# Patient Record
Sex: Female | Born: 2000 | Race: Black or African American | Hispanic: No | Marital: Single | Smoking: Never smoker
Health system: Southern US, Community
[De-identification: ages and names within clinical notes are randomized; demographics above are authoritative.]

## PROBLEM LIST (undated history)

## (undated) DIAGNOSIS — R519 Headache, unspecified: Secondary | ICD-10-CM

## (undated) DIAGNOSIS — F4310 Post-traumatic stress disorder, unspecified: Secondary | ICD-10-CM

## (undated) DIAGNOSIS — R51 Headache: Secondary | ICD-10-CM

## (undated) DIAGNOSIS — I10 Essential (primary) hypertension: Secondary | ICD-10-CM

## (undated) HISTORY — PX: TONSILECTOMY, ADENOIDECTOMY, BILATERAL MYRINGOTOMY AND TUBES: SHX2538

## (undated) HISTORY — PX: TONSILLECTOMY: SUR1361

## (undated) HISTORY — PX: WISDOM TOOTH EXTRACTION: SHX21

---
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
## 2018-01-10 ENCOUNTER — Other Ambulatory Visit: Payer: Self-pay

## 2018-01-10 ENCOUNTER — Encounter (HOSPITAL_COMMUNITY): Payer: Self-pay | Admitting: Emergency Medicine

## 2018-01-10 DIAGNOSIS — Z79899 Other long term (current) drug therapy: Secondary | ICD-10-CM | POA: Diagnosis not present

## 2018-01-10 DIAGNOSIS — Z7722 Contact with and (suspected) exposure to environmental tobacco smoke (acute) (chronic): Secondary | ICD-10-CM | POA: Insufficient documentation

## 2018-01-10 DIAGNOSIS — K8020 Calculus of gallbladder without cholecystitis without obstruction: Secondary | ICD-10-CM | POA: Diagnosis not present

## 2018-01-10 DIAGNOSIS — I10 Essential (primary) hypertension: Secondary | ICD-10-CM | POA: Diagnosis not present

## 2018-01-10 DIAGNOSIS — R1013 Epigastric pain: Secondary | ICD-10-CM | POA: Diagnosis present

## 2018-01-10 LAB — COMPREHENSIVE METABOLIC PANEL
ALBUMIN: 3.8 g/dL (ref 3.5–5.0)
ALK PHOS: 102 U/L (ref 38–126)
ALT: 13 U/L — ABNORMAL LOW (ref 14–54)
ANION GAP: 11 (ref 5–15)
AST: 19 U/L (ref 15–41)
BUN: 11 mg/dL (ref 6–20)
CALCIUM: 9.8 mg/dL (ref 8.9–10.3)
CO2: 24 mmol/L (ref 22–32)
Chloride: 104 mmol/L (ref 101–111)
Creatinine, Ser: 0.65 mg/dL (ref 0.44–1.00)
GFR calc non Af Amer: >60 mL/min (ref >60)
Glucose, Bld: 92 mg/dL (ref 65–99)
POTASSIUM: 3.9 mmol/L (ref 3.5–5.1)
SODIUM: 139 mmol/L (ref 135–145)
TOTAL PROTEIN: 7.5 g/dL (ref 6.5–8.1)
Total Bilirubin: 0.3 mg/dL (ref 0.3–1.2)

## 2018-01-10 LAB — CBC
HEMATOCRIT: 39.6 % (ref 36.0–46.0)
HEMOGLOBIN: 12.9 g/dL (ref 12.0–15.0)
MCH: 25.6 pg — AB (ref 26.0–34.0)
MCHC: 32.6 g/dL (ref 30.0–36.0)
MCV: 78.6 fL (ref 78.0–100.0)
Platelets: 312 10*3/uL (ref 150–400)
RBC: 5.04 MIL/uL (ref 3.87–5.11)
RDW: 14.5 % (ref 11.5–15.5)
WBC: 13.6 10*3/uL — ABNORMAL HIGH (ref 4.0–10.5)

## 2018-01-10 LAB — LIPASE, BLOOD: Lipase: 34 U/L (ref 11–51)

## 2018-01-10 NOTE — ED Triage Notes (Signed)
Pt c/o 10/10 intermittent abd pain for the past 2 days with nausea no vomiting, fever or chills.

## 2018-01-11 ENCOUNTER — Emergency Department (HOSPITAL_COMMUNITY)
Admission: EM | Admit: 2018-01-11 | Discharge: 2018-01-11 | Disposition: A | Discharge status: Home / Self Care | Payer: Medicaid Other | Attending: Emergency Medicine | Admitting: Emergency Medicine

## 2018-01-11 ENCOUNTER — Emergency Department (HOSPITAL_COMMUNITY): Payer: Medicaid Other

## 2018-01-11 DIAGNOSIS — R1013 Epigastric pain: Secondary | ICD-10-CM

## 2018-01-11 DIAGNOSIS — K8020 Calculus of gallbladder without cholecystitis without obstruction: Secondary | ICD-10-CM

## 2018-01-11 LAB — URINALYSIS, ROUTINE W REFLEX MICROSCOPIC
BILIRUBIN URINE: NEGATIVE
Glucose, UA: NEGATIVE mg/dL
Ketones, ur: NEGATIVE mg/dL
Leukocytes, UA: NEGATIVE
NITRITE: NEGATIVE
PROTEIN: NEGATIVE mg/dL
Specific Gravity, Urine: 1.021 (ref 1.005–1.030)
pH: 5 (ref 5.0–8.0)

## 2018-01-11 LAB — PREGNANCY, URINE: PREG TEST UR: NEGATIVE

## 2018-01-11 MED ORDER — GI COCKTAIL ~~LOC~~
30.0000 mL | Freq: Once | ORAL | Status: AC
  Administered 2018-01-11: 30 mL via ORAL
  Filled 2018-01-11: qty 30

## 2018-01-11 MED ORDER — ONDANSETRON HCL 4 MG PO TABS: 4.0000 mg | ORAL_TABLET | Freq: Four times a day (QID) | ORAL | 0 refills | Status: DC

## 2018-01-11 MED ORDER — PANTOPRAZOLE SODIUM 40 MG PO TBEC
40.0000 mg | DELAYED_RELEASE_TABLET | Freq: Once | ORAL | Status: AC
  Administered 2018-01-11: 40 mg via ORAL
  Filled 2018-01-11: qty 1

## 2018-01-11 MED ORDER — PANTOPRAZOLE SODIUM 20 MG PO TBEC: 20.0000 mg | DELAYED_RELEASE_TABLET | Freq: Every day | ORAL | 0 refills | Status: DC

## 2018-01-11 MED ORDER — ACETAMINOPHEN 325 MG PO TABS
650.0000 mg | ORAL_TABLET | Freq: Once | ORAL | Status: AC
  Administered 2018-01-11: 650 mg via ORAL
  Filled 2018-01-11: qty 2

## 2018-01-11 NOTE — ED Notes (Signed)
Patient transported to Ultrasound 

## 2018-01-11 NOTE — ED Provider Notes (Signed)
Signout from TRW AutomotiveKelly Humes, PA-C at shift change See previous provider note for full H&P  Briefly, patient with epigastric pain, intermittent; no vomiting.  Possible GERD, but biliary colic possibility. RUQ US and UA pending. If negative, plan to discharge home with PPI.  RUQ US shows:  [Examination limited by habitus and bowel gas.  Gallstones measuring up to 2.5 cm. Portion of gallbladder wall appears slightly thickened measuring up to 4.4 mm. No obvious pericholecystic fluid. Per ultrasound technologist, patient was not tender over this region during scanning.  Slightly prominent proximal common bile duct measuring 5.6 mm.  Remainder of exam unremarkable taking into account limitation by habitus and bowel gas.]  UA shows large hematuria, too numerous to count RBCs, however patient is on her menstrual cycle.  No signs of infection.  Will have patient follow-up with general surgery.  Will discharge home with PPI and Zofran, as needed.  Return precautions discussed.  Patient understands and agrees with plan.  Patient vitals stable throughout ED course and discharged in satisfactory condition.   Ebony Wilson, Ebony Bromwell M, PA-C 01/11/18 1010    Ebony Wilson, David, MD 01/17/18 2250

## 2018-01-11 NOTE — ED Provider Notes (Signed)
[facility] EMERGENCY DEPARTMENT Provider Note   CSN: [id number] Arrival date & time: 01/10/18  2028     History   Chief Complaint Chief Complaint  Patient presents with  . Abdominal Pain    HPI Ebony Wilson is a 17 y.o. female.   17 year old female with no significant past medical history presents to the emergency department for evaluation of epigastric abdominal pain.  Symptoms have been present over the past 3 days.  Pain is intermittent, slightly worse with eating.  It is associated with nausea.  No medications taken prior to arrival for symptoms.  Patient denies history of abdominal surgeries.  She is unsure of the date of her last bowel movement.  She is currently on her menstrual cycle and reports normal, regular menses.  She has had no fevers, vomiting, dysuria, hematuria, vaginal complaints.  No reported sick contacts.  She does endorse family history of acute cholecystitis.      Past Medical History:  Diagnosis Date  . Hypertension   . PTSD (post-traumatic stress disorder) 07/18/2016    Patient Active Problem List   Diagnosis Date Noted  . PTSD (post-traumatic stress disorder) 07/18/2016  . MDD (major depressive disorder), recurrent episode, severe (HCC) 07/17/2016    Past Surgical History:  Procedure Laterality Date  . TONSILECTOMY, ADENOIDECTOMY, BILATERAL MYRINGOTOMY AND TUBES    . TONSILLECTOMY     age 66    OB History    No data available       Home Medications    Prior to Admission medications   Medication Sig Start Date End Date Taking? Authorizing Provider  acetaminophen (TYLENOL) 325 MG tablet Take 650 mg by mouth every 6 (six) hours as needed.    [provider]  fluticasone (FLONASE) 50 MCG/ACT nasal spray Place 1 spray into both nostrils 2 (two) times daily. 11/02/17   Elvina Sidle, MD  ibuprofen (ADVIL,MOTRIN) 600 MG tablet Take 1 tablet (600 mg total) by mouth every 6 (six) hours as needed. 10/29/16    Earley Favor, NP  sertraline (ZOLOFT) 25 MG tablet Take 1 tablet (25 mg total) by mouth daily. 07/22/16   Denzil Magnuson, NP    Family History No family history on file.  Social History Social History   Tobacco Use  . Smoking status: Passive Smoke Exposure - Never Smoker  . Smokeless tobacco: Never Used  Substance Use Topics  . Alcohol use: No  . Drug use: No     Allergies   Watermelon [citrullus vulgaris]   Review of Systems Review of Systems Ten systems reviewed and are negative for acute change, except as noted in the HPI.    Physical Exam Updated Vital Signs BP 124/66 (BP Location: Right Arm)   Pulse 75   Temp 97.9 F (36.6 C) (Oral)   Resp 18   Ht 5\' 5"  (1.651 m)   Wt 136.1 kg (300 lb)   LMP 01/09/2018   SpO2 100%   BMI 49.92 kg/m   Physical Exam  Constitutional: She is oriented to person, place, and time. She appears well-developed and well-nourished. No distress.  Nontoxic appearing and in NAD  HENT:  Head: Normocephalic and atraumatic.  Eyes: Conjunctivae and EOM are normal. No scleral icterus.  Neck: Normal range of motion.  Cardiovascular: Normal rate, regular rhythm and intact distal pulses.  Pulmonary/Chest: Effort normal. No stridor. No respiratory distress. She has no wheezes.  Respirations even and unlabored  Abdominal: Soft. Normal appearance. She exhibits no distension. There is  tenderness. There is no guarding.  Soft, obese abdomen.  Mild tenderness in the epigastrium as well as the right upper quadrant.  Negative Murphy sign.  No palpable masses or peritoneal signs.  Musculoskeletal: Normal range of motion.  Neurological: She is alert and oriented to person, place, and time. She exhibits normal muscle tone. Coordination normal.  Skin: Skin is warm and dry. No rash noted. She is not diaphoretic. No erythema. No pallor.  Psychiatric: She has a normal mood and affect. Her behavior is normal.  Nursing note and vitals reviewed.    ED  Treatments / Results  Labs (all labs ordered are listed, but only abnormal results are displayed) Labs Reviewed  COMPREHENSIVE METABOLIC PANEL - Abnormal; Notable for the following components:      Result Value   ALT 13 (*)    All other components within normal limits  CBC - Abnormal; Notable for the following components:   WBC 13.6 (*)    MCH 25.6 (*)    All other components within normal limits  LIPASE, BLOOD  URINALYSIS, ROUTINE W REFLEX MICROSCOPIC  PREGNANCY, URINE    EKG  EKG Interpretation None       Radiology No results found.  Procedures Procedures (including critical care time)  Medications Ordered in ED Medications  gi cocktail (Maalox,Lidocaine,Donnatal) (30 mLs Oral Given 01/11/18 0541)     Initial Impression / Assessment and Plan / ED Course  I have reviewed the triage vital signs and the nursing notes.  Pertinent labs & imaging results that were available during my care of the patient were reviewed by me and considered in my medical decision making (see chart for details).     17 year old female presents to the emergency department for intermittent, waxing and waning epigastric pain.  She has mild reproducible tenderness on exam.  Abdomen soft, obese.  No peritoneal signs.  LFTs are reassuring, though unable to rule out biliary colic.  Ultrasound ordered which is in process.  Unclear whether leukocytosis may be the patient's baseline; no old for comparison.  Vitals do not currently suggest infectious etiology.  The patient has no history of fever.  No tachycardia.  Urinalysis is also pending to evaluate for UTI and potential pregnancy.  Patient signed out to Glenford BayleyAlex Law, PA-C at change of shift who will follow-up on pending imaging and labs and disposition appropriately.  If imaging and urinalysis are negative, anticipate discharge with PPI for management of GERD.   Final Clinical Impressions(s) / ED Diagnoses   Final diagnoses:  Epigastric pain    ED  Discharge Orders    None       Antony MaduraHumes, Sheliah Fiorillo, PA-C 01/11/18 [id number]    Ward, Layla MawKristen N, DO 01/11/18 [phone]

## 2018-01-11 NOTE — Discharge Instructions (Signed)
Take Protonix daily for acid reflux symptoms.  Take Zofran every 6 hours as needed for nausea or vomiting.  Try to avoid fatty, greasy foods as well as refined carbohydrates like bread, pasta, and white rice.  Please read the instructions on cholelithiasis attached your discharge paperwork.  Please call for an appointment [facility] for further management of your gallstones.  Please return to the emergency department if you develop any new or worsening symptoms including persistent fever over 100.4, intractable vomiting, significant worsening of your pain that is not going away after 6-8 hours, or any other new or concerning symptoms.

## 2018-01-11 NOTE — ED Triage Notes (Signed)
Pt to NF requesting update, apologized for delay 

## 2018-01-12 ENCOUNTER — Encounter (HOSPITAL_COMMUNITY): Payer: Self-pay | Admitting: Emergency Medicine

## 2018-01-12 ENCOUNTER — Emergency Department (HOSPITAL_COMMUNITY)
Admission: EM | Admit: 2018-01-12 | Discharge: 2018-01-12 | Disposition: A | Discharge status: Home / Self Care | Payer: Medicaid Other | Attending: Emergency Medicine | Admitting: Emergency Medicine

## 2018-01-12 DIAGNOSIS — Z5321 Procedure and treatment not carried out due to patient leaving prior to being seen by health care provider: Secondary | ICD-10-CM | POA: Diagnosis not present

## 2018-01-12 DIAGNOSIS — R109 Unspecified abdominal pain: Secondary | ICD-10-CM | POA: Diagnosis present

## 2018-01-12 LAB — COMPREHENSIVE METABOLIC PANEL
ALT: 13 U/L — AB (ref 14–54)
ANION GAP: 10 (ref 5–15)
AST: 17 U/L (ref 15–41)
Albumin: 3.5 g/dL (ref 3.5–5.0)
Alkaline Phosphatase: 98 U/L (ref 38–126)
BUN: 11 mg/dL (ref 6–20)
CALCIUM: 9.3 mg/dL (ref 8.9–10.3)
CHLORIDE: 105 mmol/L (ref 101–111)
CO2: 23 mmol/L (ref 22–32)
CREATININE: 0.74 mg/dL (ref 0.44–1.00)
GFR calc Af Amer: >60 mL/min (ref >60)
Glucose, Bld: 110 mg/dL — ABNORMAL HIGH (ref 65–99)
Potassium: 4 mmol/L (ref 3.5–5.1)
SODIUM: 138 mmol/L (ref 135–145)
Total Bilirubin: 0.3 mg/dL (ref 0.3–1.2)
Total Protein: 7 g/dL (ref 6.5–8.1)

## 2018-01-12 LAB — CBC
HCT: 37.3 % (ref 36.0–46.0)
HEMOGLOBIN: 12.2 g/dL (ref 12.0–15.0)
MCH: 25.5 pg — ABNORMAL LOW (ref 26.0–34.0)
MCHC: 32.7 g/dL (ref 30.0–36.0)
MCV: 78 fL (ref 78.0–100.0)
PLATELETS: 341 10*3/uL (ref 150–400)
RBC: 4.78 MIL/uL (ref 3.87–5.11)
RDW: 14.4 % (ref 11.5–15.5)
WBC: 10.3 10*3/uL (ref 4.0–10.5)

## 2018-01-12 LAB — LIPASE, BLOOD: LIPASE: 38 U/L (ref 11–51)

## 2018-01-12 MED ORDER — OXYCODONE-ACETAMINOPHEN 5-325 MG PO TABS
1.0000 | ORAL_TABLET | Freq: Once | ORAL | Status: AC
  Administered 2018-01-12: 1 via ORAL
  Filled 2018-01-12: qty 1

## 2018-01-12 NOTE — ED Notes (Signed)
No answer from pt in waiting room 

## 2018-01-12 NOTE — ED Triage Notes (Signed)
Pt reports increased abdominal pain since 2330 01/11/18 that she attributes to gallstones. Seen already for same. Pain meds not working. Nausea, took zofran PTA. Ibuprofen not effective.

## 2018-01-12 NOTE — ED Notes (Signed)
Pt given narcotic pain medicine in triage. Advised of side effects and instructed to avoid driving for a minimum of four hours.  

## 2018-01-12 NOTE — ED Notes (Signed)
Called in lobby NO answer

## 2018-01-12 NOTE — ED Notes (Signed)
Called in lobby x 2 NO answer

## 2018-01-26 ENCOUNTER — Ambulatory Visit: Payer: Self-pay | Admitting: General Surgery

## 2018-01-26 NOTE — H&P (View-Only) (Signed)
History of Present Illness Ebony Wilson(Rutledge Selsor MD; 01/26/2018 3:05 PM) The patient is a 17 year old female who presents for evaluation of gall stones. Referred by: Dr. Elesa MassedWard Chief Complaint: Epigastric abdominal pain  Patient is a 17 year old female who comes in secondary to epigastric abdominal pain. She states that the pain radiates her left quadrant. She states that the pain usually begins after eating. States that she is awoken at night approximately 3:00 in the morning with abdominal pain, nausea hypomelanosis. Patient states been going on for several months. He states become more frequent. She states that she tried to stay away from high-fat foods.  Patient does state that her mother did have her gallbladder removed secondary to gallstones.  Patient's previous abdominal surgeries.    Past Surgical History [name]([name], CMA; 01/26/2018 2:39 PM) Oral Surgery  Tonsillectomy   Diagnostic Studies History [name]([name], CMA; 01/26/2018 2:39 PM) Colonoscopy  never  Allergies [name]([name], CMA; 01/26/2018 2:39 PM) No Known Drug Allergies [01/26/2018]: Allergies Reconciled   Medication History [name]([name], CMA; 01/26/2018 2:39 PM) Pantoprazole Sodium (20MG  Tablet DR, Oral) Active. Ondansetron HCl (4MG  Tablet, Oral) Active. Medications Reconciled  Social History [name](Danielle [employer] fast streamerGerrigner, CMA; 01/26/2018 2:39 PM) Caffeine use  Tea. No alcohol use  No drug use  Tobacco use  Never smoker.  Family History [name]([name], CMA; 01/26/2018 2:39 PM) Family history unknown  First Degree Relatives   Pregnancy / Birth History [name]([name], CMA; 01/26/2018 2:39 PM) Age at menarche  6 years. Gravida  0 Irregular periods  Para  0  Other Problems [name]([name], CMA; 01/26/2018 2:39 PM) No pertinent past medical history     Review of Systems Ebony Wilson([name]; 01/26/2018 3:03 PM) General Present- Appetite Loss and Fatigue. Not  Present- Chills, Fever, Night Sweats, Weight Gain and Weight Loss. Skin Not Present- Change in Wart/Mole, Dryness, Hives, Jaundice, New Lesions, Non-Healing Wounds, Rash and Ulcer. HEENT Not Present- Earache, Hearing Loss, Hoarseness, Nose Bleed, Oral Ulcers, Ringing in the Ears, Seasonal Allergies, Sinus Pain, Sore Throat, Visual Disturbances, Wears glasses/contact lenses and Yellow Eyes. Respiratory Not Present- Bloody sputum, Chronic Cough, Difficulty Breathing, Snoring and Wheezing. Breast Not Present- Breast Mass, Breast Pain, Nipple Discharge and Skin Changes. Cardiovascular Not Present- Chest Pain, Difficulty Breathing Lying Down, Leg Cramps, Palpitations, Rapid Heart Rate, Shortness of Breath and Swelling of Extremities. Gastrointestinal Present- Abdominal Pain and Nausea. Not Present- Bloating, Bloody Stool, Change in Bowel Habits, Chronic diarrhea, Constipation, Difficulty Swallowing, Excessive gas, Gets full quickly at meals, Hemorrhoids, Indigestion, Rectal Pain and Vomiting. Female Genitourinary Not Present- Frequency, Nocturia, Painful Urination, Pelvic Pain and Urgency. Musculoskeletal Not Present- Back Pain, Joint Pain, Joint Stiffness, Muscle Pain, Muscle Weakness and Swelling of Extremities. Neurological Not Present- Decreased Memory, Fainting, Headaches, Numbness, Seizures, Tingling, Tremor, Trouble walking and Weakness. Psychiatric Not Present- Anxiety, Bipolar, Change in Sleep Pattern, Depression, Fearful and Frequent crying. Endocrine Not Present- Cold Intolerance, Excessive Hunger, Hair Changes, Heat Intolerance, Hot flashes and New Diabetes. Hematology Not Present- Blood Thinners, Easy Bruising, Excessive bleeding, Gland problems, HIV and Persistent Infections. All other systems negative  Vitals ([name] CMA; 01/26/2018 2:40 PM) 01/26/2018 2:39 PM Weight: 304.13 lb (Above 99th percentile) Height: 65.5in (69th percentile) Body Surface Area: 2.38 m Body  Mass Index: 49.84 kg/m  (99th percentile)  Temp.: 97.68F(Oral)  Pulse: 97 (Regular)  BP: 132/90 (Sitting, Right Arm, Large)  Percentiles calculated using CDC data for children 2-20 years.     Physical Exam Ebony Wilson([name] MD; 01/26/2018 3:05 PM) The  physical exam findings are as follows: Note:Constitutional: No acute distress, conversant, appears stated age  Eyes: Anicteric sclerae, moist conjunctiva, no lid lag  Neck: No thyromegaly, trachea midline, no cervical lymphadenopathy  Lungs: Clear to auscultation biilaterally, normal respiratory effot  Cardiovascular: regular rate & rhythm, no murmurs, no peripheal edema, pedal pulses 2+  GI: Soft, no masses or hepatosplenomegaly, tender to palpation in epigastrum  MSK: Normal gait, no clubbing cyanosis, edema  Skin: No rashes, palpation reveals normal skin turgor  Psychiatric: Appropriate judgment and insight, oriented to person, place, and time    Assessment & Plan Ebony Filler MD; 01/26/2018 3:06 PM) SYMPTOMATIC CHOLELITHIASIS (K80.20) Impression: 18 year old female symptomatically cholelithiasis  1. We will proceed to the operating room for a laparoscopic cholecystectomy  2. Risks and benefits were discussed with the patient to generally include, but not limited to: infection, bleeding, possible need for post op ERCP, damage to the bile ducts, bile leak, and possible need for further surgery. Alternatives were offered and described. All questions were answered and the patient voiced understanding of the procedure and wishes to proceed at this point with a laparoscopic cholecystectomy

## 2018-01-26 NOTE — H&P (Signed)
History of Present Illness Ebony Wilson([name] MD; 01/26/2018 3:05 PM) The patient is a 17 year old female who presents for evaluation of gall stones. Referred by: Dr. Elesa MassedWard Chief Complaint: Epigastric abdominal pain  Patient is a 17 year old female who comes in secondary to epigastric abdominal pain. She states that the pain radiates her left quadrant. She states that the pain usually begins after eating. States that she is awoken at night approximately 3:00 in the morning with abdominal pain, nausea hypomelanosis. Patient states been going on for several months. He states become more frequent. She states that she tried to stay away from high-fat foods.  Patient does state that her mother did have her gallbladder removed secondary to gallstones.  Patient's previous abdominal surgeries.    Past Surgical History [name]([name], CMA; 01/26/2018 2:39 PM) Oral Surgery  Tonsillectomy   Diagnostic Studies History [name]([name], CMA; 01/26/2018 2:39 PM) Colonoscopy  never  Allergies [name]([name], CMA; 01/26/2018 2:39 PM) No Known Drug Allergies [01/26/2018]: Allergies Reconciled   Medication History [name]([name], CMA; 01/26/2018 2:39 PM) Pantoprazole Sodium (20MG  Tablet DR, Oral) Active. Ondansetron HCl (4MG  Tablet, Oral) Active. Medications Reconciled  Social History [name](Danielle [employer] fast streamerGerrigner, CMA; 01/26/2018 2:39 PM) Caffeine use  Tea. No alcohol use  No drug use  Tobacco use  Never smoker.  Family History [name]([name], CMA; 01/26/2018 2:39 PM) Family history unknown  First Degree Relatives   Pregnancy / Birth History [name]([name], CMA; 01/26/2018 2:39 PM) Age at menarche  6 years. Gravida  0 Irregular periods  Para  0  Other Problems [name]([name], CMA; 01/26/2018 2:39 PM) No pertinent past medical history     Review of Systems Ebony Wilson(Jasie Meleski MD; 01/26/2018 3:03 PM) General Present- Appetite Loss and Fatigue. Not  Present- Chills, Fever, Night Sweats, Weight Gain and Weight Loss. Skin Not Present- Change in Wart/Mole, Dryness, Hives, Jaundice, New Lesions, Non-Healing Wounds, Rash and Ulcer. HEENT Not Present- Earache, Hearing Loss, Hoarseness, Nose Bleed, Oral Ulcers, Ringing in the Ears, Seasonal Allergies, Sinus Pain, Sore Throat, Visual Disturbances, Wears glasses/contact lenses and Yellow Eyes. Respiratory Not Present- Bloody sputum, Chronic Cough, Difficulty Breathing, Snoring and Wheezing. Breast Not Present- Breast Mass, Breast Pain, Nipple Discharge and Skin Changes. Cardiovascular Not Present- Chest Pain, Difficulty Breathing Lying Down, Leg Cramps, Palpitations, Rapid Heart Rate, Shortness of Breath and Swelling of Extremities. Gastrointestinal Present- Abdominal Pain and Nausea. Not Present- Bloating, Bloody Stool, Change in Bowel Habits, Chronic diarrhea, Constipation, Difficulty Swallowing, Excessive gas, Gets full quickly at meals, Hemorrhoids, Indigestion, Rectal Pain and Vomiting. Female Genitourinary Not Present- Frequency, Nocturia, Painful Urination, Pelvic Pain and Urgency. Musculoskeletal Not Present- Back Pain, Joint Pain, Joint Stiffness, Muscle Pain, Muscle Weakness and Swelling of Extremities. Neurological Not Present- Decreased Memory, Fainting, Headaches, Numbness, Seizures, Tingling, Tremor, Trouble walking and Weakness. Psychiatric Not Present- Anxiety, Bipolar, Change in Sleep Pattern, Depression, Fearful and Frequent crying. Endocrine Not Present- Cold Intolerance, Excessive Hunger, Hair Changes, Heat Intolerance, Hot flashes and New Diabetes. Hematology Not Present- Blood Thinners, Easy Bruising, Excessive bleeding, Gland problems, HIV and Persistent Infections. All other systems negative  Vitals ([name] CMA; 01/26/2018 2:40 PM) 01/26/2018 2:39 PM Weight: 304.13 lb (Above 99th percentile) Height: 65.5in (69th percentile) Body Surface Area: 2.38 m Body  Mass Index: 49.84 kg/m  (99th percentile)  Temp.: 97.68F(Oral)  Pulse: 97 (Regular)  BP: 132/90 (Sitting, Right Arm, Large)  Percentiles calculated using CDC data for children 2-20 years.     Physical Exam Ebony Wilson(Bridget Westbrooks MD; 01/26/2018 3:05 PM) The  physical exam findings are as follows: Note:Constitutional: No acute distress, conversant, appears stated age  Eyes: Anicteric sclerae, moist conjunctiva, no lid lag  Neck: No thyromegaly, trachea midline, no cervical lymphadenopathy  Lungs: Clear to auscultation biilaterally, normal respiratory effot  Cardiovascular: regular rate & rhythm, no murmurs, no peripheal edema, pedal pulses 2+  GI: Soft, no masses or hepatosplenomegaly, tender to palpation in epigastrum  MSK: Normal gait, no clubbing cyanosis, edema  Skin: No rashes, palpation reveals normal skin turgor  Psychiatric: Appropriate judgment and insight, oriented to person, place, and time    Assessment & Plan Ebony Filler MD; 01/26/2018 3:06 PM) SYMPTOMATIC CHOLELITHIASIS (K80.20) Impression: 18 year old female symptomatically cholelithiasis  1. We will proceed to the operating room for a laparoscopic cholecystectomy  2. Risks and benefits were discussed with the patient to generally include, but not limited to: infection, bleeding, possible need for post op ERCP, damage to the bile ducts, bile leak, and possible need for further surgery. Alternatives were offered and described. All questions were answered and the patient voiced understanding of the procedure and wishes to proceed at this point with a laparoscopic cholecystectomy

## 2018-02-04 ENCOUNTER — Encounter (HOSPITAL_COMMUNITY): Payer: Self-pay | Admitting: *Deleted

## 2018-02-07 MED ORDER — ACETAMINOPHEN 500 MG PO TABS
1000.0000 mg | ORAL_TABLET | ORAL | Status: AC
  Administered 2018-02-08: 1000 mg via ORAL
  Filled 2018-02-07: qty 2

## 2018-02-07 MED ORDER — CELECOXIB 200 MG PO CAPS
200.0000 mg | ORAL_CAPSULE | ORAL | Status: AC
  Administered 2018-02-08: 200 mg via ORAL
  Filled 2018-02-07: qty 1

## 2018-02-07 MED ORDER — DEXTROSE 5 % IV SOLN
3.0000 g | INTRAVENOUS | Status: AC
  Administered 2018-02-08: 3 g via INTRAVENOUS
  Filled 2018-02-07: qty 3

## 2018-02-07 MED ORDER — GABAPENTIN 300 MG PO CAPS
300.0000 mg | ORAL_CAPSULE | ORAL | Status: AC
  Administered 2018-02-08: 300 mg via ORAL
  Filled 2018-02-07: qty 1

## 2018-02-08 ENCOUNTER — Ambulatory Visit (HOSPITAL_COMMUNITY)
Admission: RE | Admit: 2018-02-08 | Discharge: 2018-02-08 | Disposition: A | Discharge status: Home / Self Care | Payer: Medicaid Other | Source: Ambulatory Visit | Attending: General Surgery | Admitting: General Surgery

## 2018-02-08 ENCOUNTER — Ambulatory Visit (HOSPITAL_COMMUNITY): Payer: Medicaid Other | Admitting: Certified Registered"

## 2018-02-08 ENCOUNTER — Encounter (HOSPITAL_COMMUNITY)
Admission: RE | Disposition: A | Discharge status: Home / Self Care | Payer: Self-pay | Source: Ambulatory Visit | Attending: General Surgery

## 2018-02-08 ENCOUNTER — Encounter (HOSPITAL_COMMUNITY): Payer: Self-pay

## 2018-02-08 DIAGNOSIS — I10 Essential (primary) hypertension: Secondary | ICD-10-CM | POA: Insufficient documentation

## 2018-02-08 DIAGNOSIS — R1013 Epigastric pain: Secondary | ICD-10-CM | POA: Diagnosis present

## 2018-02-08 DIAGNOSIS — Z68.41 Body mass index (BMI) pediatric, greater than or equal to 95th percentile for age: Secondary | ICD-10-CM | POA: Diagnosis not present

## 2018-02-08 DIAGNOSIS — K8010 Calculus of gallbladder with chronic cholecystitis without obstruction: Secondary | ICD-10-CM | POA: Insufficient documentation

## 2018-02-08 HISTORY — PX: CHOLECYSTECTOMY: SHX55

## 2018-02-08 HISTORY — DX: Headache: R51

## 2018-02-08 HISTORY — DX: Headache, unspecified: R51.9

## 2018-02-08 LAB — CBC
HCT: 39.9 % (ref 36.0–46.0)
HEMOGLOBIN: 12.5 g/dL (ref 12.0–15.0)
MCH: 24.7 pg — AB (ref 26.0–34.0)
MCHC: 31.3 g/dL (ref 30.0–36.0)
MCV: 78.7 fL (ref 78.0–100.0)
Platelets: 318 10*3/uL (ref 150–400)
RBC: 5.07 MIL/uL (ref 3.87–5.11)
RDW: 15.1 % (ref 11.5–15.5)
WBC: 11.5 10*3/uL — ABNORMAL HIGH (ref 4.0–10.5)

## 2018-02-08 LAB — HCG, SERUM, QUALITATIVE: Preg, Serum: NEGATIVE

## 2018-02-08 SURGERY — LAPAROSCOPIC CHOLECYSTECTOMY
Anesthesia: General | Site: Abdomen

## 2018-02-08 MED ORDER — PHENYLEPHRINE 40 MCG/ML (10ML) SYRINGE FOR IV PUSH (FOR BLOOD PRESSURE SUPPORT)
PREFILLED_SYRINGE | INTRAVENOUS | Status: AC
  Filled 2018-02-08: qty 10

## 2018-02-08 MED ORDER — LIDOCAINE HCL (CARDIAC) 20 MG/ML IV SOLN
INTRAVENOUS | Status: DC | PRN
  Administered 2018-02-08: 60 mg via INTRAVENOUS

## 2018-02-08 MED ORDER — BUPIVACAINE HCL 0.25 % IJ SOLN
INTRAMUSCULAR | Status: DC | PRN
  Administered 2018-02-08: 30 mL

## 2018-02-08 MED ORDER — BUPIVACAINE HCL (PF) 0.25 % IJ SOLN
INTRAMUSCULAR | Status: AC
  Filled 2018-02-08: qty 30

## 2018-02-08 MED ORDER — DEXAMETHASONE SODIUM PHOSPHATE 10 MG/ML IJ SOLN
INTRAMUSCULAR | Status: AC
  Filled 2018-02-08: qty 1

## 2018-02-08 MED ORDER — CHLORHEXIDINE GLUCONATE CLOTH 2 % EX PADS: 6.0000 | MEDICATED_PAD | Freq: Once | CUTANEOUS | Status: DC

## 2018-02-08 MED ORDER — OXYCODONE HCL 5 MG PO TABS
ORAL_TABLET | ORAL | Status: AC
  Filled 2018-02-08: qty 2

## 2018-02-08 MED ORDER — LACTATED RINGERS IV SOLN
INTRAVENOUS | Status: DC
Start: 2018-02-08 — End: 2018-02-08
  Administered 2018-02-08 (×2): via INTRAVENOUS

## 2018-02-08 MED ORDER — DEXAMETHASONE SODIUM PHOSPHATE 10 MG/ML IJ SOLN
INTRAMUSCULAR | Status: DC | PRN
  Administered 2018-02-08: 8 mg via INTRAVENOUS

## 2018-02-08 MED ORDER — HYDROMORPHONE HCL 1 MG/ML IJ SOLN
0.2500 mg | INTRAMUSCULAR | Status: DC | PRN
  Administered 2018-02-08: 0.5 mg via INTRAVENOUS

## 2018-02-08 MED ORDER — SODIUM CHLORIDE 0.9 % IR SOLN
Status: DC | PRN
  Administered 2018-02-08: 1000 mL

## 2018-02-08 MED ORDER — MIDAZOLAM HCL 5 MG/5ML IJ SOLN
INTRAMUSCULAR | Status: DC | PRN
  Administered 2018-02-08: 2 mg via INTRAVENOUS

## 2018-02-08 MED ORDER — KETOROLAC TROMETHAMINE 15 MG/ML IJ SOLN: 15.0000 mg | Freq: Four times a day (QID) | INTRAMUSCULAR | Status: DC

## 2018-02-08 MED ORDER — OXYCODONE HCL 5 MG PO TABS
5.0000 mg | ORAL_TABLET | ORAL | Status: DC | PRN
  Administered 2018-02-08: 10 mg via ORAL

## 2018-02-08 MED ORDER — MIDAZOLAM HCL 2 MG/2ML IJ SOLN
INTRAMUSCULAR | Status: AC
  Filled 2018-02-08: qty 2

## 2018-02-08 MED ORDER — FENTANYL CITRATE (PF) 100 MCG/2ML IJ SOLN
INTRAMUSCULAR | Status: DC | PRN
  Administered 2018-02-08 (×3): 50 ug via INTRAVENOUS

## 2018-02-08 MED ORDER — ONDANSETRON HCL 4 MG/2ML IJ SOLN
INTRAMUSCULAR | Status: AC
  Filled 2018-02-08: qty 2

## 2018-02-08 MED ORDER — 0.9 % SODIUM CHLORIDE (POUR BTL) OPTIME
TOPICAL | Status: DC | PRN
  Administered 2018-02-08: 1000 mL

## 2018-02-08 MED ORDER — GLYCOPYRROLATE 0.2 MG/ML IJ SOLN
INTRAMUSCULAR | Status: DC | PRN
  Administered 2018-02-08: .8 mg via INTRAVENOUS

## 2018-02-08 MED ORDER — KETOROLAC TROMETHAMINE 30 MG/ML IJ SOLN
INTRAMUSCULAR | Status: DC | PRN
  Administered 2018-02-08: 30 mg via INTRAVENOUS

## 2018-02-08 MED ORDER — ROCURONIUM BROMIDE 100 MG/10ML IV SOLN
INTRAVENOUS | Status: DC | PRN
  Administered 2018-02-08: 40 mg via INTRAVENOUS
  Administered 2018-02-08: 10 mg via INTRAVENOUS

## 2018-02-08 MED ORDER — FENTANYL CITRATE (PF) 250 MCG/5ML IJ SOLN
INTRAMUSCULAR | Status: AC
  Filled 2018-02-08: qty 5

## 2018-02-08 MED ORDER — PROPOFOL 10 MG/ML IV BOLUS
INTRAVENOUS | Status: AC
  Filled 2018-02-08: qty 20

## 2018-02-08 MED ORDER — ONDANSETRON HCL 4 MG/2ML IJ SOLN
INTRAMUSCULAR | Status: DC | PRN
  Administered 2018-02-08: 4 mg via INTRAVENOUS

## 2018-02-08 MED ORDER — NEOSTIGMINE METHYLSULFATE 5 MG/5ML IV SOSY
PREFILLED_SYRINGE | INTRAVENOUS | Status: AC
  Filled 2018-02-08: qty 5

## 2018-02-08 MED ORDER — NEOSTIGMINE METHYLSULFATE 10 MG/10ML IV SOLN
INTRAVENOUS | Status: DC | PRN
  Administered 2018-02-08: 5 mg via INTRAVENOUS

## 2018-02-08 MED ORDER — HYDROMORPHONE HCL 1 MG/ML IJ SOLN
INTRAMUSCULAR | Status: AC
  Filled 2018-02-08: qty 1

## 2018-02-08 MED ORDER — TRAMADOL HCL 50 MG PO TABS: 50.0000 mg | ORAL_TABLET | Freq: Four times a day (QID) | ORAL | 0 refills | Status: DC | PRN

## 2018-02-08 MED ORDER — PROPOFOL 10 MG/ML IV BOLUS
INTRAVENOUS | Status: DC | PRN
  Administered 2018-02-08: 200 mg via INTRAVENOUS

## 2018-02-08 SURGICAL SUPPLY — 40 items
CANISTER SUCT 3000ML PPV (MISCELLANEOUS) ×3 IMPLANT
CHLORAPREP W/TINT 26ML (MISCELLANEOUS) ×3 IMPLANT
CLIP VESOLOCK MED LG 6/CT (CLIP) ×6 IMPLANT
COVER SURGICAL LIGHT HANDLE (MISCELLANEOUS) ×3 IMPLANT
COVER TRANSDUCER ULTRASND (DRAPES) ×3 IMPLANT
DERMABOND ADVANCED (GAUZE/BANDAGES/DRESSINGS) ×2
DERMABOND ADVANCED .7 DNX12 (GAUZE/BANDAGES/DRESSINGS) ×1 IMPLANT
ELECT REM PT RETURN 9FT ADLT (ELECTROSURGICAL) ×3
ELECTRODE REM PT RTRN 9FT ADLT (ELECTROSURGICAL) ×1 IMPLANT
GLOVE BIO SURGEON STRL SZ7.5 (GLOVE) ×3 IMPLANT
GLOVE BIOGEL PI IND STRL 6.5 (GLOVE) ×1 IMPLANT
GLOVE BIOGEL PI IND STRL 8 (GLOVE) ×1 IMPLANT
GLOVE BIOGEL PI INDICATOR 6.5 (GLOVE) ×2
GLOVE BIOGEL PI INDICATOR 8 (GLOVE) ×2
GLOVE INDICATOR 7.5 STRL GRN (GLOVE) ×3 IMPLANT
GLOVE SURG SS PI 6.5 STRL IVOR (GLOVE) ×6 IMPLANT
GOWN STRL REUS W/ TWL LRG LVL3 (GOWN DISPOSABLE) ×3 IMPLANT
GOWN STRL REUS W/ TWL XL LVL3 (GOWN DISPOSABLE) ×1 IMPLANT
GOWN STRL REUS W/TWL LRG LVL3 (GOWN DISPOSABLE) ×6
GOWN STRL REUS W/TWL XL LVL3 (GOWN DISPOSABLE) ×2
GRASPER SUT TROCAR 14GX15 (MISCELLANEOUS) ×3 IMPLANT
KIT BASIN OR (CUSTOM PROCEDURE TRAY) ×3 IMPLANT
KIT ROOM TURNOVER OR (KITS) ×3 IMPLANT
NEEDLE INSUFFLATION 14GA 120MM (NEEDLE) ×3 IMPLANT
NS IRRIG 1000ML POUR BTL (IV SOLUTION) ×3 IMPLANT
PAD ARMBOARD 7.5X6 YLW CONV (MISCELLANEOUS) ×3 IMPLANT
POUCH RETRIEVAL ECOSAC 10 (ENDOMECHANICALS) ×1 IMPLANT
POUCH RETRIEVAL ECOSAC 10MM (ENDOMECHANICALS) ×2
SCISSORS LAP 5X35 DISP (ENDOMECHANICALS) ×3 IMPLANT
SET IRRIG TUBING LAPAROSCOPIC (IRRIGATION / IRRIGATOR) ×3 IMPLANT
SLEEVE ENDOPATH XCEL 5M (ENDOMECHANICALS) ×3 IMPLANT
SPECIMEN JAR SMALL (MISCELLANEOUS) ×3 IMPLANT
SUT MNCRL AB 4-0 PS2 18 (SUTURE) ×3 IMPLANT
TOWEL OR 17X24 6PK STRL BLUE (TOWEL DISPOSABLE) ×3 IMPLANT
TOWEL OR 17X26 10 PK STRL BLUE (TOWEL DISPOSABLE) IMPLANT
TRAY LAPAROSCOPIC MC (CUSTOM PROCEDURE TRAY) ×3 IMPLANT
TROCAR XCEL NON-BLD 11X100MML (ENDOMECHANICALS) ×3 IMPLANT
TROCAR XCEL NON-BLD 5MMX100MML (ENDOMECHANICALS) ×3 IMPLANT
TUBING INSUFFLATION (TUBING) ×3 IMPLANT
WATER STERILE IRR 1000ML POUR (IV SOLUTION) ×3 IMPLANT

## 2018-02-08 NOTE — Transfer of Care (Signed)
Immediate Anesthesia Transfer of Care Note  Patient: Ebony Wilson  Procedure(s) Performed: LAPAROSCOPIC CHOLECYSTECTOMY (N/A Abdomen)  Patient Location: PACU  Anesthesia Type:General  Level of Consciousness: awake  Airway & Oxygen Therapy: Patient Spontanous Breathing  Post-op Assessment: Report given to RN  Post vital signs: stable  Last Vitals:  Vitals:   02/08/18 1114  BP: 138/71  Pulse: 73  Resp: 18  Temp: 36.5 C  SpO2: 98%    Last Pain:  Vitals:   02/08/18 1114  TempSrc: Oral      Patients Stated Pain Goal: 0 (02/08/18 1057)  Complications: No apparent anesthesia complications

## 2018-02-08 NOTE — Discharge Instructions (Signed)
[facility] ______[facility] °LAPAROSCOPIC SURGERY: POST OP INSTRUCTIONS °Always review your discharge instruction sheet given to you by the facility where your surgery was performed. °IF YOU HAVE DISABILITY OR FAMILY LEAVE FORMS, YOU MUST BRING THEM TO THE OFFICE FOR PROCESSING.   °DO NOT GIVE THEM TO YOUR DOCTOR. ° °1. A prescription for pain medication may be given to you upon discharge.  Take your pain medication as prescribed, if needed.  If narcotic pain medicine is not needed, then you may take acetaminophen (Tylenol) or ibuprofen (Advil) as needed. °2. Take your usually prescribed medications unless otherwise directed. °3. If you need a refill on your pain medication, please contact your pharmacy.  They will contact our office to request authorization. Prescriptions will not be filled after 5pm or on week-ends. °4. You should follow a light diet the first few days after arrival home, such as soup and crackers, etc.  Be sure to include lots of fluids daily. °5. Most patients will experience some swelling and bruising in the area of the incisions.  Ice packs will help.  Swelling and bruising can take several days to resolve.  °6. It is common to experience some constipation if taking pain medication after surgery.  Increasing fluid intake and taking a stool softener (such as Colace) will usually help or prevent this problem from occurring.  A mild laxative (Milk of Magnesia or Miralax) should be taken according to package instructions if there are no bowel movements after 48 hours. °7. Unless discharge instructions indicate otherwise, you may remove your bandages 24-48 hours after surgery, and you may shower at that time.  You may have steri-strips (small skin tapes) in place directly over the incision.  These strips should be left on the skin for 7-10 days.  If your surgeon used skin glue on the incision, you may shower in 24 hours.  The glue will flake off over the next 2-3 weeks.  Any sutures or  staples will be removed at the office during your follow-up visit. °8. ACTIVITIES:  You may resume regular (light) daily activities beginning the next day--such as daily self-care, walking, climbing stairs--gradually increasing activities as tolerated.  You may have sexual intercourse when it is comfortable.  Refrain from any heavy lifting or straining until approved by your doctor. °a. You may drive when you are no longer taking prescription pain medication, you can comfortably wear a seatbelt, and you can safely maneuver your car and apply brakes. °b. RETURN TO WORK:  __________________________________________________________ °9. You should see your doctor in the office for a follow-up appointment approximately 2-3 weeks after your surgery.  Make sure that you call for this appointment within a day or two after you arrive home to insure a convenient appointment time. °10. OTHER INSTRUCTIONS: __________________________________________________________________________________________________________________________ __________________________________________________________________________________________________________________________ °WHEN TO CALL YOUR DOCTOR: °1. Fever over 101.0 °2. Inability to urinate °3. Continued bleeding from incision. °4. Increased pain, redness, or drainage from the incision. °5. Increasing abdominal pain ° °The clinic staff is available to answer your questions during regular business hours.  Please don’t hesitate to call and ask to speak to one of the nurses for clinical concerns.  If you have a medical emergency, go to the nearest emergency room or call 911.  A surgeon from [facility] is always on call at the hospital. °[address], [address], [place], [place]  [address] ? [address] [address], [place], [place]   [address] °[phone] ? [phone] ? FAX [phone] °Web site:   [email or web address] °

## 2018-02-08 NOTE — Op Note (Signed)
02/08/2018  2:00 PM  PATIENT:  Ebony Wilson  17 y.o. female  PRE-OPERATIVE DIAGNOSIS:  gallstones  POST-OPERATIVE DIAGNOSIS:  Gallstones, chronic cholecystitis  PROCEDURE:  Procedure(s): LAPAROSCOPIC CHOLECYSTECTOMY (N/A)  SURGEON:  Surgeon(s) and Role:    Axel Filler* Tarell Schollmeyer, MD - Primary  ANESTHESIA:   local and general  EBL:  minimal   BLOOD ADMINISTERED:none  DRAINS: none   LOCAL MEDICATIONS USED:  BUPIVICAINE   SPECIMEN:  Source of Specimen:  gallbladder  DISPOSITION OF SPECIMEN:  PATHOLOGY  COUNTS:  YES  TOURNIQUET:  * No tourniquets in log *  DICTATION: .Dragon Dictation   EBL: <5cc   Complications: none   Counts: reported as correct x 2   Findings:chronic inflammation of the gallbladder and gallstones  Indications for procedure: Pt is a 17 y/o F with RUQ pain and seen to have gallstones.   Details of the procedure: The patient was taken to the operating and placed in the supine position with bilateral SCDs in place. A time out was called and all facts were verified. A pneumoperitoneum was obtained via A Veress needle technique to a pressure of 14mm of mercury. A 5mm trochar was then placed in the right upper quadrant under visualization, and there were no injuries to any abdominal organs. A 11 mm port was then placed in the umbilical region after infiltrating with local anesthesia under direct visualization. A second epigastric port was placed under direct visualization.   The gallbladder was identified and retracted, the peritoneum was then sharply dissected from the gallbladder and this dissection was carried down to Calot's triangle. The cystic duct was identified and dissected circumferentially and seen going into the gallbladder 360.  The cystic artery was dissected away from the surrounding tissues.   The critical angle was obtained.   2 clips were placed proximally one distally and the cystic duct transected. The cystic artery was identified and 2  clips placed proximally and one distally and transected. We then proceeded to remove the gallbladder off the hepatic fossa with Bovie cautery. A retrieval bag was then placed in the abdomen and gallbladder placed in the bag. The hepatic fossa was then reexamined and hemostasis was achieved with Bovie cautery and was excellent at this portion of the case. The subhepatic fossa and perihepatic fossa was then irrigated until the effluent was clear. The specimen bag and specimen were removed from the abdominal cavity.  The 11 mm trocar fascia was reapproximated with the Endo Close #1 Vicryl x2. The pneumoperitoneum was evacuated and all trochars removed under direct visulalization. The skin was then closed with 4-0 Monocryl and the skin dressed with Steri-Strips, gauze, and tape. The patient was awaken from general anesthesia and taken to the recovery room in stable condition.    PLAN OF CARE: Discharge to home after PACU  PATIENT DISPOSITION:  PACU - hemodynamically stable.   Delay start of Pharmacological VTE agent (>24hrs) due to surgical blood loss or risk of bleeding: not applicable

## 2018-02-08 NOTE — Anesthesia Preprocedure Evaluation (Addendum)
Anesthesia Evaluation  Patient identified by MRN, date of birth, ID band Patient awake    Reviewed: Allergy & Precautions, H&P , NPO status , Patient's Chart, lab work & pertinent test results  Airway Mallampati: II  TM Distance: >3 FB Neck ROM: Full    Dental no notable dental hx. (+) Teeth Intact, Dental Advisory Given   Pulmonary neg pulmonary ROS,    Pulmonary exam normal breath sounds clear to auscultation       Cardiovascular hypertension,  Rhythm:Regular Rate:Normal     Neuro/Psych  Headaches, Anxiety Depression    GI/Hepatic negative GI ROS, Neg liver ROS,   Endo/Other  Morbid obesity  Renal/GU negative Renal ROS  negative genitourinary   Musculoskeletal   Abdominal   Peds  Hematology negative hematology ROS (+)   Anesthesia Other Findings   Reproductive/Obstetrics negative OB ROS                           Anesthesia Physical Anesthesia Plan  ASA: II  Anesthesia Plan: General   Post-op Pain Management:    Induction: Intravenous  PONV Risk Score and Plan: 4 or greater and Ondansetron, Dexamethasone and Midazolam  Airway Management Planned: Oral ETT  Additional Equipment:   Intra-op Plan:   Post-operative Plan: Extubation in OR  Informed Consent: I have reviewed the patients History and Physical, chart, labs and discussed the procedure including the risks, benefits and alternatives for the proposed anesthesia with the patient or authorized representative who has indicated his/her understanding and acceptance.   Dental advisory given  Plan Discussed with: CRNA  Anesthesia Plan Comments:         Anesthesia Quick Evaluation

## 2018-02-08 NOTE — Interval H&P Note (Signed)
History and Physical Interval Note:  02/08/2018 12:32 PM  Ebony Wilson  has presented today for surgery, with the diagnosis of gallstones  The various methods of treatment have been discussed with the patient and family. After consideration of risks, benefits and other options for treatment, the patient has consented to  Procedure(s): LAPAROSCOPIC CHOLECYSTECTOMY (N/A) as a surgical intervention .  The patient's history has been reviewed, patient examined, no change in status, stable for surgery.  I have reviewed the patient's chart and labs.  Questions were answered to the patient's satisfaction.     Marigene Ehlersamirez Jr., Jed LimerickArmando

## 2018-02-08 NOTE — Anesthesia Procedure Notes (Signed)
Procedure Name: Intubation Date/Time: 02/08/2018 1:14 PM Performed by: Josie Dixon, CRNA Pre-anesthesia Checklist: Timeout performed, Patient being monitored, Suction available, Emergency Drugs available and Patient identified Patient Re-evaluated:Patient Re-evaluated prior to induction Oxygen Delivery Method: Circle system utilized Preoxygenation: Pre-oxygenation with 100% oxygen Induction Type: IV induction Ventilation: Mask ventilation without difficulty Laryngoscope Size: Mac and 4 Grade View: Grade II Tube type: Oral Tube size: 7.0 mm Number of attempts: 1 Airway Equipment and Method: Stylet Placement Confirmation: ETT inserted through vocal cords under direct vision,  positive ETCO2 and breath sounds checked- equal and bilateral Secured at: 21 cm Tube secured with: Tape Dental Injury: Teeth and Oropharynx as per pre-operative assessment

## 2018-02-09 ENCOUNTER — Encounter (HOSPITAL_COMMUNITY): Payer: Self-pay | Admitting: General Surgery

## 2018-02-09 NOTE — Anesthesia Postprocedure Evaluation (Signed)
Anesthesia Post Note  Patient: Ebony Wilson  Procedure(s) Performed: LAPAROSCOPIC CHOLECYSTECTOMY (N/A Abdomen)     Patient location during evaluation: PACU Anesthesia Type: General Level of consciousness: awake and alert Pain management: pain level controlled Vital Signs Assessment: post-procedure vital signs reviewed and stable Respiratory status: spontaneous breathing, nonlabored ventilation and respiratory function stable Cardiovascular status: blood pressure returned to baseline and stable Postop Assessment: no apparent nausea or vomiting Anesthetic complications: no    Last Vitals:  Vitals:   02/08/18 1441 02/08/18 1500  BP:    Pulse: 92   Resp: 17   Temp:  36.7 C  SpO2: 93%     Last Pain:  Vitals:   02/08/18 1441  TempSrc:   PainSc: 5                  Bexleigh Theriault,W. EDMOND

## 2018-08-09 ENCOUNTER — Other Ambulatory Visit: Payer: Self-pay

## 2018-08-09 ENCOUNTER — Emergency Department (HOSPITAL_COMMUNITY)
Admission: EM | Admit: 2018-08-09 | Discharge: 2018-08-09 | Disposition: A | Discharge status: Home / Self Care | Payer: Medicaid Other | Attending: Emergency Medicine | Admitting: Emergency Medicine

## 2018-08-09 ENCOUNTER — Encounter (HOSPITAL_COMMUNITY): Payer: Self-pay

## 2018-08-09 ENCOUNTER — Emergency Department (HOSPITAL_COMMUNITY): Payer: Medicaid Other

## 2018-08-09 DIAGNOSIS — Y929 Unspecified place or not applicable: Secondary | ICD-10-CM | POA: Insufficient documentation

## 2018-08-09 DIAGNOSIS — Y939 Activity, unspecified: Secondary | ICD-10-CM | POA: Insufficient documentation

## 2018-08-09 DIAGNOSIS — Z79899 Other long term (current) drug therapy: Secondary | ICD-10-CM | POA: Diagnosis not present

## 2018-08-09 DIAGNOSIS — W010XXA Fall on same level from slipping, tripping and stumbling without subsequent striking against object, initial encounter: Secondary | ICD-10-CM | POA: Diagnosis not present

## 2018-08-09 DIAGNOSIS — Y999 Unspecified external cause status: Secondary | ICD-10-CM | POA: Diagnosis not present

## 2018-08-09 DIAGNOSIS — I10 Essential (primary) hypertension: Secondary | ICD-10-CM | POA: Diagnosis not present

## 2018-08-09 DIAGNOSIS — S8392XA Sprain of unspecified site of left knee, initial encounter: Secondary | ICD-10-CM | POA: Insufficient documentation

## 2018-08-09 MED ORDER — ACETAMINOPHEN 500 MG PO TABS
1000.0000 mg | ORAL_TABLET | Freq: Once | ORAL | Status: AC
  Administered 2018-08-09: 1000 mg via ORAL
  Filled 2018-08-09: qty 2

## 2018-08-09 NOTE — ED Triage Notes (Signed)
Per patient was "goofing around" and hurt left knee, previous injury 1 year ago, now complains of grinding when weight bearing, no loc, had motrin 400mg  @ 10pm last night

## 2018-08-09 NOTE — ED Notes (Signed)
Patient awake alert,color pink,chest clear,good aeration,no retractions 3 plus pulses<2sec refill,pt with md to greet upon arrival to room,left knee pain

## 2018-08-09 NOTE — ED Provider Notes (Signed)
[facility] EMERGENCY DEPARTMENT Provider Note   CSN: [id number] Arrival date & time: 08/09/18  1134     History   Chief Complaint Chief Complaint  Patient presents with  . Knee Pain    HPI Ebony Wilson is a 17 y.o. female.  17 year old female with past medical history including hypertension, cholecystectomy, PTSD who presents with left knee pain.  Yesterday evening the patient was "goofing around" and slipped on a floor, her left leg slid outward causing pain in her left knee.  Initially her knee seemed to be okay but this morning her pain was worse so she called EMS.  She took Motrin around 10 PM last night.  She does not want to bear weight on her leg today because of the severity of her pain.  She denies any other areas of injury and no loss of consciousness.  She notes that a year ago she sprained her left knee and this current pain feels similar.  The history is provided by the patient.  Knee Pain   Pertinent negatives include no numbness.    Past Medical History:  Diagnosis Date  . Headache    at her period   . Hypertension    as a child (had to diet to get bp down, no longer an issue)  . PTSD (post-traumatic stress disorder) 07/18/2016    Patient Active Problem List   Diagnosis Date Noted  . PTSD (post-traumatic stress disorder) 07/18/2016  . MDD (major depressive disorder), recurrent episode, severe (HCC) 07/17/2016    Past Surgical History:  Procedure Laterality Date  . CHOLECYSTECTOMY N/A 02/08/2018   Procedure: LAPAROSCOPIC CHOLECYSTECTOMY;  Surgeon: Axel Filler, MD;  Location: [facility] OR;  Service: General;  Laterality: N/A;  . TONSILECTOMY, ADENOIDECTOMY, BILATERAL MYRINGOTOMY AND TUBES    . TONSILLECTOMY     age 15  . WISDOM TOOTH EXTRACTION       OB History   None      Home Medications    Prior to Admission medications   Medication Sig Start Date End Date Taking? Authorizing Provider  acetaminophen (TYLENOL) 325 MG tablet  Take 650 mg by mouth every 6 (six) hours as needed.    [provider]  cetirizine (ZYRTEC) 10 MG tablet Take 10 mg by mouth at bedtime. 12/15/17   [provider]  fluticasone (FLONASE) 50 MCG/ACT nasal spray Place 1 spray into both nostrils 2 (two) times daily. 11/02/17   Elvina Sidle, MD  montelukast (SINGULAIR) 10 MG tablet Take 10 mg by mouth at bedtime. 12/15/17   [provider]  ondansetron (ZOFRAN) 4 MG tablet Take 1 tablet (4 mg total) by mouth every 6 (six) hours. 01/11/18   Law, Waylan Boga, PA-C  pantoprazole (PROTONIX) 20 MG tablet Take 1 tablet (20 mg total) by mouth daily. 01/11/18   Law, Alexandra M, PA-C  PATADAY 0.2 % SOLN Place 1 drop into both eyes daily. For 30 days 12/15/17   [provider]  sertraline (ZOLOFT) 25 MG tablet Take 1 tablet (25 mg total) by mouth daily. Patient not taking: Reported on 01/11/2018 07/22/16   Denzil Magnuson, NP  traMADol (ULTRAM) 50 MG tablet Take 1 tablet (50 mg total) by mouth every 6 (six) hours as needed. 02/08/18 02/08/19  Axel Filler, MD    Family History Family History  Problem Relation Age of Onset  . Other Mother        has a hole in her heart    Social History Social History  Tobacco Use  . Smoking status: Never Smoker  . Smokeless tobacco: Never Used  Substance Use Topics  . Alcohol use: No  . Drug use: No     Allergies   Watermelon [citrullus vulgaris]   Review of Systems Review of Systems  Musculoskeletal: Positive for arthralgias.  Skin: Negative for wound.  Neurological: Negative for numbness.     Physical Exam Updated Vital Signs BP 122/80 (BP Location: Left Arm)   Pulse 71   Temp 98.2 F (36.8 C) (Oral)   Resp 18   Wt 133.8 kg Comment: verified by patient  LMP 07/18/2018   SpO2 100%   BMI 49.09 kg/m   Physical Exam  Constitutional: She is oriented to person, place, and time. She appears well-developed and well-nourished. No distress.  HENT:  Head:  Normocephalic and atraumatic.  Eyes: Conjunctivae are normal.  Cardiovascular: Intact distal pulses.  Musculoskeletal: She exhibits tenderness. She exhibits no edema or deformity.  Exam limited due to patient not cooperative; tenderness on medial and lateral left knee with no edema or joint effusion; reluctance to flex at knee 2/2 pain, no joint laxity, negative anterior/posterior drawer signs; no ankle swelling or tenderness  Neurological: She is alert and oriented to person, place, and time. No sensory deficit.  Normal sensation b/l lower extremities  Skin: Skin is warm and dry. No erythema.  Psychiatric: She has a normal mood and affect.  Nursing note and vitals reviewed.    ED Treatments / Results  Labs (all labs ordered are listed, but only abnormal results are displayed) Labs Reviewed - No data to display  EKG None  Radiology Dg Knee Complete 4 Views Left  Result Date: 08/09/2018 CLINICAL DATA:  Twisted the left knee last night with pain, difficulty bearing weight EXAM: LEFT KNEE - COMPLETE 4+ VIEW COMPARISON:  None. FINDINGS: The left knee joint spaces are well preserved. No fracture is seen. No joint effusion is noted. IMPRESSION: Negative. Electronically Signed   By: Dwyane Dee M.D.   On: 08/09/2018 12:29    Procedures Procedures (including critical care time)  Medications Ordered in ED Medications  acetaminophen (TYLENOL) tablet 1,000 mg (1,000 mg Oral Given 08/09/18 1157)     Initial Impression / Assessment and Plan / ED Course  I have reviewed the triage vital signs and the nursing notes.  Pertinent imaging results that were available during my care of the patient were reviewed by me and considered in my medical decision making (see chart for details).  Clinical Course as of Aug 10 1411  Tue Aug 09, 2018  1233 DG Knee Complete 4 Views Left [JT]    Clinical Course User Index [JT] Hillery Aldo, Jo-Ku Loraine Leriche), Student-PA    XR knee negative. She has knee brace at  home.  Discussed supportive measures including ice, NSAIDs, elevation, and follow-up if needed for ongoing problems.  Final Clinical Impressions(s) / ED Diagnoses   Final diagnoses:  Sprain of left knee, unspecified ligament, initial encounter    ED Discharge Orders    None       Little, Ambrose Finland, MD 08/09/18 1413

## 2018-08-17 ENCOUNTER — Encounter (HOSPITAL_COMMUNITY): Payer: Self-pay | Admitting: Emergency Medicine

## 2018-08-17 ENCOUNTER — Emergency Department (HOSPITAL_COMMUNITY)
Admission: EM | Admit: 2018-08-17 | Discharge: 2018-08-17 | Disposition: A | Discharge status: Home / Self Care | Payer: Medicaid Other | Attending: Emergency Medicine | Admitting: Emergency Medicine

## 2018-08-17 ENCOUNTER — Other Ambulatory Visit: Payer: Self-pay

## 2018-08-17 DIAGNOSIS — Z79899 Other long term (current) drug therapy: Secondary | ICD-10-CM | POA: Insufficient documentation

## 2018-08-17 DIAGNOSIS — Z9049 Acquired absence of other specified parts of digestive tract: Secondary | ICD-10-CM | POA: Diagnosis not present

## 2018-08-17 DIAGNOSIS — I10 Essential (primary) hypertension: Secondary | ICD-10-CM | POA: Insufficient documentation

## 2018-08-17 DIAGNOSIS — F329 Major depressive disorder, single episode, unspecified: Secondary | ICD-10-CM | POA: Insufficient documentation

## 2018-08-17 DIAGNOSIS — M25562 Pain in left knee: Secondary | ICD-10-CM | POA: Insufficient documentation

## 2018-08-17 MED ORDER — IBUPROFEN 600 MG PO TABS: 600.0000 mg | ORAL_TABLET | Freq: Four times a day (QID) | ORAL | 0 refills | Status: DC | PRN

## 2018-08-17 MED ORDER — IBUPROFEN 400 MG PO TABS
600.0000 mg | ORAL_TABLET | Freq: Once | ORAL | Status: AC
  Administered 2018-08-17: 19:00:00 600 mg via ORAL
  Filled 2018-08-17: qty 1

## 2018-08-17 NOTE — ED Provider Notes (Signed)
Patient placed in Quick Look pathway, seen and evaluated   Chief Complaint: left knee pain  HPI:   Ebony Wilson is a 17 y.o. female who presents to the ED for left knee pain. Patient was evaluated for same 08/12/18 and had x-ray and referred to ortho. Patient reports she has not made an appointment for follow up. Patient taking NSAIDS for pain without relief. Patient c/o continued swelling of the left knee. Using ace wrap without relief.   ROS: M/S: left knee pain and swelling  Physical Exam:  BP 119/87 (BP Location: Right Arm)   Pulse 98   Temp 98.2 F (36.8 C) (Oral)   Resp 16   Ht 5' 5.5" (1.664 m)   Wt 134 kg   LMP 07/18/2018   SpO2 100%   BMI 48.41 kg/m    Gen: No distress  Neuro: Awake and Alert  Skin: Warm and dry  M/S: pain to left knee with range of motion   Initiation of care has begun. The patient has been counseled on the process, plan, and necessity for staying for the completion/evaluation, and the remainder of the medical screening examination    Janne Napoleon, NP 08/17/18 1701    Tegeler, Canary Brim, MD 08/18/18 0005

## 2018-08-17 NOTE — ED Notes (Signed)
Patient verbalizes understanding of discharge instructions. Opportunity for questioning and answers were provided. Armband removed by staff, pt discharged from ED.  

## 2018-08-17 NOTE — Progress Notes (Signed)
Orthopedic Tech Progress Note Patient Details:  Ebony Wilson 03-Jan-2001 [id number]  Ortho Devices Type of Ortho Device: Crutches Ortho Device/Splint Interventions: Application   Post Interventions Patient Tolerated: Well  Nursing staff provide knee immobilizer Nikki Dom 08/17/2018, 7:03 PM

## 2018-08-17 NOTE — Discharge Instructions (Signed)
Please read and follow all provided instructions.  Your diagnoses today include: Knee Effusion   Tests performed today include: X-rays of the affected joint.  Vital signs. See below for your results today.   Be sure to read and understand instructions below prior to leaving the hospital. If your symptoms persist without any improvement in 1 week it is recommended that you follow up with the orthopedics listed above. Use your pain medication as prescribed.  Knee Effusion  The medical term for having fluid in your knee is effusion. This means something is wrong inside the knee. Some of the causes of fluid in the knee may be torn cartilage, a torn ligament, or bleeding into the joint from an injury. Small tears may heal on their own with conservative treatment. Conservative means rest, limited weight bearing activity and muscle strengthening exercises. Your recovery may take up to 6 weeks. Larger tears may require surgery.   TREATMENT  Rest, ice, compression and elevation (RICE therapy) are the basic modes of treatment.   Rest is needed to allow your body to heal. Routine activities can be resumed when comfortable (as described above).  Ice: Ice or gel packs can be used to reduce both pain and swelling. Ice is the most helpful within the first 24 to 48 hours after an injury or flareup from overusing a muscle or joint.  Ice is effective, has very few side effects, and is safe for most people to use. Place a dry or damp towel between the ice and skin. A damp towel will cool the skin more quickly, so you may need to shorten the time that the ice is used. For a more rapid response, add gentle compression to the ice. Ice for no more than 10 to 20 minutes at a time. The bonier the area you are icing, the less time it will take to get the benefits of ice. Check your skin after 5 minutes to make sure there are no signs of a poor response to cold or skin damage. Rest 20 minutes or more in between uses. Once your  skin is numb, you can end your treatment. You can test numbness by very lightly touching your skin. The touch should be so light that you do not see the skin dimple from the pressure of your fingertip. When using ice, most people will feel these normal sensations in this order: cold, burning, aching, and numbness. Do not use ice on someone who cannot communicate their responses to pain, such as small children or people with dementia.  If you expose your skin to cold temperatures for too long or without the proper protection, you can damage your skin or nerves. Watch for signs of skin damage due to cold.  Compression: this helps keep swelling down. It also gives support and helps with discomfort. If any lasting bandage has been applied, it should be removed and reapplied every 3-4 hours. It should not be applied tightly, but firmly enough to keep swelling down. Watch fingers or toes for swelling, discoloration, coldness, numbness or excessive pain. If any of these problems occur, removed the bandage and reapply loosely. Contact your caregiver if these problems continue. If you were given an knee imobilizer you may take it off at night and to take a shower or bath. Wiggle your toes in the splint several times per day if you are able.  Elevation helps reduce swelling and decrease your pain. With extremities such as the arms, hands, legs and feet, the injured  area should be placed near or above the level of the heart if possible (place pillows underneath you leg/foot while you sleep to achieve this). If your caregiver recommends crutches, use them as instructed (for up to) 1 week.  Do not drive a vehicle on pain medication. ACTIVITY:            - Weight bearing as tolerated            - Exercises should be limited to pain free range of motion  Knee Immobilization:: This is used to support and protect an injured or painful knee. Knee immobilizers keep your knee from being used while it is healing.  Use powder  to control irritation from sweat and friction.  Adjust the immobilizer to be firm but not tight. Signs of an immobilizer that is too tight include:   Swelling.   Numbness.   Color change in your foot or ankle.   Increased pain.  While resting, raise your leg above the level of your heart. This reduces throbbing and helps healing. Prop it up with pillows.  Remove the immobilizer to bathe and sleep. Wear it other times until you see your doctor again.               SEEK MEDICAL CARE IF:  You have an increase in bruising, swelling, or pain.  Your toes feel cold.  Pain relief is not achieved with medications.  EMERGENCY:: Your toes are numb or blue or you have severe pain.  You notice redness, swelling, warmth or increasing pain in your knee.  An unexplained oral temperature above 102 F (38.9 C) develops.  HOW TO MAKE AN ICE PACK  To make an ice pack, do one of the following:  Place crushed ice or a bag of frozen vegetables in a sealable plastic bag. Squeeze out the excess air. Place this bag inside another plastic bag. Slide the bag into a pillowcase or place a damp towel between your skin and the bag.  Mix 3 parts water with 1 part rubbing alcohol. Freeze the mixture in a sealable plastic bag. When you remove the mixture from the freezer, it will be slushy. Squeeze out the excess air. Place this bag inside another plastic bag. Slide the bag into a pillowcase or place a damp towel between your skin.  Additional Information:  Your vital signs today were: BP 119/87 (BP Location: Right Arm)    Pulse 98    Temp 98.2 F (36.8 C) (Oral)    Resp 16    Ht 5' 5.5" (1.664 m)    Wt 134 kg    LMP 07/18/2018    SpO2 100%    BMI 48.41 kg/m  If your blood pressure (BP) was elevated above 135/85 this visit, please have this repeated by your doctor within one month. ---------------

## 2018-08-17 NOTE — ED Provider Notes (Signed)
[facility] EMERGENCY DEPARTMENT Provider Note   CSN: [id number] Arrival date & time: 08/17/18  1608     History   Chief Complaint Chief Complaint  Patient presents with  . Knee Pain    HPI Ebony Wilson is a 17 y.o. female past medical history of hypertension PTSD presents emergency department for continued left knee pain.  Patient was seen here on 9/3 for the same.  She reports she was "goofing around" with her boyfriend when she slipped on the floor, causing her left knees to slide outwards.  She reports that her left knee was initially painful which is why she presented to the emergency department.  She had negative x-rays at that time and was discharged home with Ace wrap, muscle relaxers and ibuprofen.  She was recommended to follow-up with orthopedist but has not.  She reports that she has continued pain and mild swelling to the knee since that time.  She has been using muscle relaxers and an Ace wrap for this.  She reports her pain is exacerbated with ambulation.  She reports that she did have some relief when she was taking ibuprofen but has since run out and is requesting a prescription.  She states this feels similar to when she sprained her knee approximately 1 year ago.  Her pain is worsened with flexion and improved with extension.  Fever, numbness, tingling or weakness.  HPI  Past Medical History:  Diagnosis Date  . Headache    at her period   . Hypertension    as a child (had to diet to get bp down, no longer an issue)  . PTSD (post-traumatic stress disorder) 07/18/2016    Patient Active Problem List   Diagnosis Date Noted  . PTSD (post-traumatic stress disorder) 07/18/2016  . MDD (major depressive disorder), recurrent episode, severe (HCC) 07/17/2016    Past Surgical History:  Procedure Laterality Date  . CHOLECYSTECTOMY N/A 02/08/2018   Procedure: LAPAROSCOPIC CHOLECYSTECTOMY;  Surgeon: Axel Filler, MD;  Location: [facility] OR;  Service: General;   Laterality: N/A;  . TONSILECTOMY, ADENOIDECTOMY, BILATERAL MYRINGOTOMY AND TUBES    . TONSILLECTOMY     age 56  . WISDOM TOOTH EXTRACTION       OB History   None      Home Medications    Prior to Admission medications   Medication Sig Start Date End Date Taking? Authorizing Provider  acetaminophen (TYLENOL) 325 MG tablet Take 650 mg by mouth every 6 (six) hours as needed.    [provider]  cetirizine (ZYRTEC) 10 MG tablet Take 10 mg by mouth at bedtime. 12/15/17   [provider]  fluticasone (FLONASE) 50 MCG/ACT nasal spray Place 1 spray into both nostrils 2 (two) times daily. 11/02/17   Elvina Sidle, MD  montelukast (SINGULAIR) 10 MG tablet Take 10 mg by mouth at bedtime. 12/15/17   [provider]  ondansetron (ZOFRAN) 4 MG tablet Take 1 tablet (4 mg total) by mouth every 6 (six) hours. 01/11/18   Law, Waylan Boga, PA-C  pantoprazole (PROTONIX) 20 MG tablet Take 1 tablet (20 mg total) by mouth daily. 01/11/18   Law, Alexandra M, PA-C  PATADAY 0.2 % SOLN Place 1 drop into both eyes daily. For 30 days 12/15/17   [provider]  sertraline (ZOLOFT) 25 MG tablet Take 1 tablet (25 mg total) by mouth daily. Patient not taking: Reported on 01/11/2018 07/22/16   Denzil Magnuson, NP  traMADol (ULTRAM) 50 MG tablet Take 1  tablet (50 mg total) by mouth every 6 (six) hours as needed. 02/08/18 02/08/19  Axel Filler, MD    Family History Family History  Problem Relation Age of Onset  . Other Mother        has a hole in her heart    Social History Social History   Tobacco Use  . Smoking status: Never Smoker  . Smokeless tobacco: Never Used  Substance Use Topics  . Alcohol use: No  . Drug use: No     Allergies   Watermelon [citrullus vulgaris]   Review of Systems Review of Systems  Constitutional: Negative for fever.  Musculoskeletal: Positive for arthralgias and joint swelling.  Skin: Negative for color change and wound.  Neurological:  Negative for numbness.  All other systems reviewed and are negative.    Physical Exam Updated Vital Signs BP 119/87 (BP Location: Right Arm)   Pulse 98   Temp 98.2 F (36.8 C) (Oral)   Resp 16   Ht 5' 5.5" (1.664 m)   Wt 134 kg   LMP 07/18/2018   SpO2 100%   BMI 48.41 kg/m   Physical Exam  Constitutional: She appears well-developed and well-nourished.  HENT:  Head: Normocephalic and atraumatic.  Right Ear: External ear normal.  Left Ear: External ear normal.  Eyes: Conjunctivae are normal. Right eye exhibits no discharge. Left eye exhibits no discharge. No scleral icterus.  Cardiovascular:  Pulses:      Dorsalis pedis pulses are 2+ on the right side, and 2+ on the left side.       Posterior tibial pulses are 2+ on the right side, and 2+ on the left side.  Pulmonary/Chest: Effort normal. No respiratory distress.  Musculoskeletal:  Appearance of left knee.  Mild joint swelling.  Positive ballottement test. No obvious deformity. No skin swelling, erythema, heat, fluctuance or break of the skin. TTP over anterior joint line. Active and passive extension  intact without pain or crepitus.  Patinet with mild limitation of flexion 2/2 swelling. Negative Lachman's test. Negative anterior/poster drawer bilaterally. Negative McMurray's test. Negative ballottement test. No varus or valgus laxity or locking. No TTP of hips or ankles. Compartments soft. Neurovascularly intact distally to site of injury.  Neurological: She is alert. She has normal strength. No sensory deficit.  Skin: Skin is warm and dry. Capillary refill takes less than 2 seconds. No erythema. No pallor.  Psychiatric: She has a normal mood and affect.  Nursing note and vitals reviewed.    ED Treatments / Results  Labs (all labs ordered are listed, but only abnormal results are displayed) Labs Reviewed - No data to display  EKG None  Radiology No results found.  Procedures Procedures (including critical care  time)  Medications Ordered in ED Medications  ibuprofen (ADVIL,MOTRIN) tablet 600 mg (has no administration in time range)     Initial Impression / Assessment and Plan / ED Course  I have reviewed the triage vital signs and the nursing notes.  Pertinent labs & imaging results that were available during my care of the patient were reviewed by me and considered in my medical decision making (see chart for details).     Pt with mild swelling to the joint spaces, knee swelling, tightness in the knee, and mildly restricted range of motion. Pt unable to perform full flexion of the knee.  Pt is without systemic symptoms, erythema or redness of the joint consistent with gout or septic joint.  Patient X-Ray reviewed from last visit  and negative for obvious fracture or dislocation. Do not feel she needs another xray. No further injury reported.  Pain managed in ED. Pt advised to follow up with orthopedics for further evaluation and treatment. Patient given knee immobilizer and crutches while in ED, conservative therapy recommended and discussed. Patient will be dc home & is agreeable with above plan.  Final Clinical Impressions(s) / ED Diagnoses   Final diagnoses:  Acute pain of left knee    ED Discharge Orders         Ordered    ibuprofen (ADVIL,MOTRIN) 600 MG tablet  Every 6 hours PRN     08/17/18 1851           Princella Pellegrini 08/17/18 1851    Bethann Berkshire, MD 08/19/18 1218

## 2018-08-17 NOTE — ED Triage Notes (Addendum)
Pt reports L knee pain since 9/3, was seen then and dx with knee sprain. Pt reports no improvement since. Pt reports she has not followed up with ortho.

## 2018-09-14 ENCOUNTER — Other Ambulatory Visit: Payer: Self-pay

## 2018-09-14 ENCOUNTER — Ambulatory Visit (HOSPITAL_COMMUNITY)
Admission: EM | Admit: 2018-09-14 | Discharge: 2018-09-14 | Disposition: A | Discharge status: Home / Self Care | Payer: Medicaid Other | Attending: Family Medicine | Admitting: Family Medicine

## 2018-09-14 ENCOUNTER — Encounter (HOSPITAL_COMMUNITY): Payer: Self-pay | Admitting: Emergency Medicine

## 2018-09-14 DIAGNOSIS — Z3202 Encounter for pregnancy test, result negative: Secondary | ICD-10-CM

## 2018-09-14 DIAGNOSIS — N644 Mastodynia: Secondary | ICD-10-CM | POA: Diagnosis not present

## 2018-09-14 LAB — POCT PREGNANCY, URINE: Preg Test, Ur: NEGATIVE

## 2018-09-14 NOTE — ED Provider Notes (Addendum)
[facility]   [id number] 09/14/18 Arrival Time: 1719  CC: Breast pain  SUBJECTIVE:  Ebony Wilson is a 17 y.o. female who presents with a breast swelling and bruise x 1 week.  Denies trauma, precipitating event, or changes in bras.  Localizes the pain and swelling to left upper breast.  Describes it as painful.  Denies aggravating or alleviating factors.  Denies similar symptoms in the past.   Denies fever, chills, nausea, vomiting, erythema, dimpling, nipple discharge, LAD, SOB, chest pain, abdominal pain, changes in bowel or bladder function.    Denies family hx of ovarian or breast cancer.    Patient also mentions concern for pregnancy.  Last unprotected sex 1 week ago.  LMP 2.5 weeks ago.    Patient's last menstrual period was 08/26/2018 (approximate).  ROS: As per HPI.  Past Medical History:  Diagnosis Date  . Headache    at her period   . Hypertension    as a child (had to diet to get bp down, no longer an issue)  . PTSD (post-traumatic stress disorder) 07/18/2016   Past Surgical History:  Procedure Laterality Date  . CHOLECYSTECTOMY N/A 02/08/2018   Procedure: LAPAROSCOPIC CHOLECYSTECTOMY;  Surgeon: Axel Filler, MD;  Location: [facility] OR;  Service: General;  Laterality: N/A;  . TONSILECTOMY, ADENOIDECTOMY, BILATERAL MYRINGOTOMY AND TUBES    . TONSILLECTOMY     age 70  . WISDOM TOOTH EXTRACTION     Allergies  Allergen Reactions  . Watermelon [Citrullus Vulgaris] Itching and Swelling    Makes lips swell and throat itch!! NO MELONS!!   No current facility-administered medications on file prior to encounter.    Current Outpatient Medications on File Prior to Encounter  Medication Sig Dispense Refill  . acetaminophen (TYLENOL) 325 MG tablet Take 650 mg by mouth every 6 (six) hours as needed.    . cetirizine (ZYRTEC) 10 MG tablet Take 10 mg by mouth at bedtime.  11  . fluticasone (FLONASE) 50 MCG/ACT nasal spray Place 1 spray into both nostrils 2 (two) times  daily. 16 g 12  . ibuprofen (ADVIL,MOTRIN) 600 MG tablet Take 1 tablet (600 mg total) by mouth every 6 (six) hours as needed. 30 tablet 0  . montelukast (SINGULAIR) 10 MG tablet Take 10 mg by mouth at bedtime.  11  . ondansetron (ZOFRAN) 4 MG tablet Take 1 tablet (4 mg total) by mouth every 6 (six) hours. 12 tablet 0  . pantoprazole (PROTONIX) 20 MG tablet Take 1 tablet (20 mg total) by mouth daily. 30 tablet 0  . PATADAY 0.2 % SOLN Place 1 drop into both eyes daily. For 30 days  11  . traMADol (ULTRAM) 50 MG tablet Take 1 tablet (50 mg total) by mouth every 6 (six) hours as needed. 20 tablet 0   Social History   Socioeconomic History  . Marital status: Single    Spouse name: Not on file  . Number of children: Not on file  . Years of education: Not on file  . Highest education level: Not on file  Occupational History  . Not on file  Social Needs  . Financial resource strain: Not on file  . Food insecurity:    Worry: Not on file    Inability: Not on file  . Transportation needs:    Medical: Not on file    Non-medical: Not on file  Tobacco Use  . Smoking status: Never Smoker  . Smokeless tobacco: Never Used  Substance and Sexual Activity  .  Alcohol use: No  . Drug use: No  . Sexual activity: Never    Birth control/protection: Abstinence  Lifestyle  . Physical activity:    Days per week: Not on file    Minutes per session: Not on file  . Stress: Not on file  Relationships  . Social connections:    Talks on phone: Not on file    Gets together: Not on file    Attends religious service: Not on file    Active member of club or organization: Not on file    Attends meetings of clubs or organizations: Not on file    Relationship status: Not on file  . Intimate partner violence:    Fear of current or ex partner: Not on file    Emotionally abused: Not on file    Physically abused: Not on file    Forced sexual activity: Not on file  Other Topics Concern  . Not on file  Social  History Narrative  . Not on file   Family History  Problem Relation Age of Onset  . Other Mother        has a hole in her heart    OBJECTIVE: Vitals:   09/14/18 1753  BP: 130/82  Pulse: (!) 105  Temp: 98.1 F (36.7 C)  TempSrc: Oral  SpO2: 100%    General appearance: alert; no distress Lungs: clear to auscultation bilaterally Heart: regular rate and rhythm.  Radial pulse 2+ bilaterally Extremities: no edema Breast:  Breast appear symmetrical without obvious deformity,  light brown ecchymosis localized in 9-12 o'clock position of left breast, mildly tender to palpation, bilateral breast without obvious lumps, obvious nipple discharge, or axillary LAD Skin: warm and dry Psychological: alert and cooperative; normal mood and affect  ASSESSMENT & PLAN:  1. Breast pain   2. Pregnancy test negative     No orders of the defined types were placed in this encounter.   UPT negative  I am recommending additional imaging for further evaluation and management Please follow up with PCP or [facility] following the results of your test(s) Primary care provider assistance initiated to establish care  Return or go to the ED if you have any new or worsening symptoms Given information for [facility]  Reviewed expectations re: course of current medical issues. Questions answered. Outlined signs and symptoms indicating need for more acute intervention. Patient verbalized understanding. After Visit Summary given.   Rennis Harding, PA-C 09/14/18 1921    Rennis Harding, PA-C 09/14/18 1922

## 2018-09-14 NOTE — Discharge Instructions (Addendum)
I am recommending additional imaging for further evaluation and management Please follow up with PCP or [facility] following the results of your test(s) Primary care provider assistance initiated to establish care  Return or go to the ED if you have any new or worsening symptoms

## 2018-09-14 NOTE — ED Triage Notes (Addendum)
Pt reports a swelling painful bruise to her left upper breast that she states is not from any trauma.  She states she just woke up and it was there.  Pt is also requesting a pregnancy test.  Her mother will be in the room with her and she does not want her to know she is being tested or the results known in front of her mother.

## 2018-09-19 ENCOUNTER — Other Ambulatory Visit: Payer: Self-pay

## 2018-09-19 ENCOUNTER — Ambulatory Visit (HOSPITAL_COMMUNITY)
Admission: EM | Admit: 2018-09-19 | Discharge: 2018-09-19 | Disposition: A | Discharge status: Home / Self Care | Payer: Medicaid Other | Attending: Family Medicine | Admitting: Family Medicine

## 2018-09-19 ENCOUNTER — Encounter (HOSPITAL_COMMUNITY): Payer: Self-pay | Admitting: Emergency Medicine

## 2018-09-19 DIAGNOSIS — R102 Pelvic and perineal pain: Secondary | ICD-10-CM

## 2018-09-19 DIAGNOSIS — Z3202 Encounter for pregnancy test, result negative: Secondary | ICD-10-CM

## 2018-09-19 DIAGNOSIS — R110 Nausea: Secondary | ICD-10-CM | POA: Diagnosis not present

## 2018-09-19 NOTE — ED Provider Notes (Signed)
[facility]    CSN: [id number] Arrival date & time: 09/19/18  1214     History   Chief Complaint Chief Complaint  Patient presents with  . Possible Pregnancy    HPI Ebony Wilson is a 17 y.o. female no contributing past medical history presenting today for evaluation of the pregnancy test.  Patient would like to have a pregnancy test performed as she has been feeling "weird" recently.  Over the past week she has felt nauseous, mainly in the mornings.  Noted she had unprotected intercourse approximately 1/2 weeks ago.  She has had some intermittent discomfort to her right groin area.  Denies any dysuria or increase in frequency.  Denies abnormal discharge or pelvic pain.  Denies vaginal irritation.  Last menstrual period was 08/26/2018.  Not on any form of birth control.  HPI  Past Medical History:  Diagnosis Date  . Headache    at her period   . Hypertension    as a child (had to diet to get bp down, no longer an issue)  . PTSD (post-traumatic stress disorder) 07/18/2016    Patient Active Problem List   Diagnosis Date Noted  . PTSD (post-traumatic stress disorder) 07/18/2016  . MDD (major depressive disorder), recurrent episode, severe (HCC) 07/17/2016    Past Surgical History:  Procedure Laterality Date  . CHOLECYSTECTOMY N/A 02/08/2018   Procedure: LAPAROSCOPIC CHOLECYSTECTOMY;  Surgeon: Axel Filler, MD;  Location: [facility] OR;  Service: General;  Laterality: N/A;  . TONSILECTOMY, ADENOIDECTOMY, BILATERAL MYRINGOTOMY AND TUBES    . TONSILLECTOMY     age 22  . WISDOM TOOTH EXTRACTION      OB History   None      Home Medications    Prior to Admission medications   Medication Sig Start Date End Date Taking? Authorizing Provider  acetaminophen (TYLENOL) 325 MG tablet Take 650 mg by mouth every 6 (six) hours as needed.    [provider]  cetirizine (ZYRTEC) 10 MG tablet Take 10 mg by mouth at bedtime. 12/15/17   [provider]    fluticasone (FLONASE) 50 MCG/ACT nasal spray Place 1 spray into both nostrils 2 (two) times daily. 11/02/17   Elvina Sidle, MD  ibuprofen (ADVIL,MOTRIN) 600 MG tablet Take 1 tablet (600 mg total) by mouth every 6 (six) hours as needed. 08/17/18   Maczis, Elmer Sow, PA-C  montelukast (SINGULAIR) 10 MG tablet Take 10 mg by mouth at bedtime. 12/15/17   [provider]  ondansetron (ZOFRAN) 4 MG tablet Take 1 tablet (4 mg total) by mouth every 6 (six) hours. 01/11/18   Law, Waylan Boga, PA-C  pantoprazole (PROTONIX) 20 MG tablet Take 1 tablet (20 mg total) by mouth daily. 01/11/18   Law, Alexandra M, PA-C  PATADAY 0.2 % SOLN Place 1 drop into both eyes daily. For 30 days 12/15/17   [provider]  traMADol (ULTRAM) 50 MG tablet Take 1 tablet (50 mg total) by mouth every 6 (six) hours as needed. 02/08/18 02/08/19  Axel Filler, MD    Family History Family History  Problem Relation Age of Onset  . Other Mother        has a hole in her heart    Social History Social History   Tobacco Use  . Smoking status: Never Smoker  . Smokeless tobacco: Never Used  Substance Use Topics  . Alcohol use: No  . Drug use: No     Allergies   Watermelon [citrullus vulgaris]   Review  of Systems Review of Systems  Constitutional: Negative for fever.  Respiratory: Negative for shortness of breath.   Cardiovascular: Negative for chest pain.  Gastrointestinal: Positive for nausea. Negative for abdominal pain, diarrhea and vomiting.  Genitourinary: Negative for dysuria, flank pain, genital sores, hematuria, menstrual problem, vaginal bleeding, vaginal discharge and vaginal pain.  Musculoskeletal: Negative for back pain.  Skin: Negative for rash.  Neurological: Negative for dizziness, light-headedness and headaches.     Physical Exam Triage Vital Signs ED Triage Vitals [09/19/18 1301]  Enc Vitals Group     BP 124/83     Pulse Rate 83     Resp      Temp 98.1 F (36.7 C)     Temp  Source Oral     SpO2 100 %     Weight      Height      Head Circumference      Peak Flow      Pain Score 0     Pain Loc      Pain Edu?      Excl. in GC?    No data found.  Updated Vital Signs BP 124/83 (BP Location: Left Arm)   Pulse 83   Temp 98.1 F (36.7 C) (Oral)   LMP 08/26/2018 (Approximate)   SpO2 100%   Visual Acuity Right Eye Distance:   Left Eye Distance:   Bilateral Distance:    Right Eye Near:   Left Eye Near:    Bilateral Near:     Physical Exam  Constitutional: She appears well-developed and well-nourished. No distress.  HENT:  Head: Normocephalic and atraumatic.  Mouth/Throat: Oropharynx is clear and moist.  Eyes: Conjunctivae are normal.  Neck: Neck supple.  Cardiovascular: Normal rate and regular rhythm.  No murmur heard. Pulmonary/Chest: Effort normal and breath sounds normal. No respiratory distress.  Abdominal: Soft. There is no tenderness.  Musculoskeletal: She exhibits no edema.  Neurological: She is alert.  Skin: Skin is warm and dry.  Psychiatric: She has a normal mood and affect.  Nursing note and vitals reviewed.    UC Treatments / Results  Labs (all labs ordered are listed, but only abnormal results are displayed) Labs Reviewed - No data to display  EKG None  Radiology No results found.  Procedures Procedures (including critical care time)  Medications Ordered in UC Medications - No data to display  Initial Impression / Assessment and Plan / UC Course  I have reviewed the triage vital signs and the nursing notes.  Pertinent labs & imaging results that were available during my care of the patient were reviewed by me and considered in my medical decision making (see chart for details).    Pregnancy test negative, will continue to monitor symptoms and monitor for normal menstrual cycle later this month.  Offered patient Zofran for nausea, but patient declined.  Follow-up if developing new symptoms, not starting cycle or  worsening symptoms.Discussed strict return precautions. Patient verbalized understanding and is agreeable with plan.   Final Clinical Impressions(s) / UC Diagnoses   Final diagnoses:  Encounter for pregnancy test with result negative     Discharge Instructions     Pregnancy negative Return if symptoms not improving worsening, not starting your menstrual cycle as expected    ED Prescriptions    None     Controlled Substance Prescriptions [place] Controlled Substance Registry consulted? Not Applicable   Lew Dawes, [place] 09/19/18 1328

## 2018-09-19 NOTE — Discharge Instructions (Signed)
Pregnancy negative Return if symptoms not improving worsening, not starting your menstrual cycle as expected

## 2018-09-19 NOTE — ED Triage Notes (Signed)
Pt here for a pregnancy test.  She did not take one at home.

## 2018-09-20 ENCOUNTER — Other Ambulatory Visit: Payer: Self-pay | Admitting: Emergency Medicine

## 2018-09-20 ENCOUNTER — Inpatient Hospital Stay (HOSPITAL_COMMUNITY)
Admission: AD | Admit: 2018-09-20 | Discharge: 2018-09-20 | Disposition: A | Discharge status: Home / Self Care | Payer: Medicaid Other | Source: Ambulatory Visit | Attending: Obstetrics & Gynecology | Admitting: Obstetrics & Gynecology

## 2018-09-20 ENCOUNTER — Other Ambulatory Visit: Payer: Self-pay

## 2018-09-20 DIAGNOSIS — Z3202 Encounter for pregnancy test, result negative: Secondary | ICD-10-CM | POA: Diagnosis not present

## 2018-09-20 DIAGNOSIS — B9689 Other specified bacterial agents as the cause of diseases classified elsewhere: Secondary | ICD-10-CM | POA: Insufficient documentation

## 2018-09-20 DIAGNOSIS — N898 Other specified noninflammatory disorders of vagina: Secondary | ICD-10-CM | POA: Diagnosis present

## 2018-09-20 DIAGNOSIS — N760 Acute vaginitis: Secondary | ICD-10-CM | POA: Diagnosis not present

## 2018-09-20 DIAGNOSIS — N644 Mastodynia: Secondary | ICD-10-CM

## 2018-09-20 LAB — URINALYSIS, ROUTINE W REFLEX MICROSCOPIC
Bilirubin Urine: NEGATIVE
Glucose, UA: NEGATIVE mg/dL
Hgb urine dipstick: NEGATIVE
Ketones, ur: NEGATIVE mg/dL
LEUKOCYTES UA: NEGATIVE
NITRITE: NEGATIVE
Protein, ur: NEGATIVE mg/dL
SPECIFIC GRAVITY, URINE: 1.031 — AB (ref 1.005–1.030)
pH: 6 (ref 5.0–8.0)

## 2018-09-20 LAB — WET PREP, GENITAL
SPERM: NONE SEEN
Trich, Wet Prep: NONE SEEN
YEAST WET PREP: NONE SEEN

## 2018-09-20 MED ORDER — METRONIDAZOLE 500 MG PO TABS: 500.0000 mg | ORAL_TABLET | Freq: Two times a day (BID) | ORAL | 0 refills | Status: DC

## 2018-09-20 NOTE — MAU Note (Signed)
Pt presents to MAU for CHECK UP. Pt states she has had vaginal itching and burning and itching since yesterday.

## 2018-09-20 NOTE — MAU Provider Note (Signed)
History     CSN: [id number]  Arrival date and time: 09/20/18 1744   First Provider Initiated Contact with Patient 09/20/18 1802      No chief complaint on file.  HPI   Ebony Wilson is a 17 y.o. non-pregnant patient who presents to MAU with chief complaint of vaginal irritation, new onset two weeks ago. Patient also requests pregnancy confirmation. Patient states she was told by her mother she should report to MAU ED for evaluation. Denies intercourse, other OB or GYN concerns. Declines full STI testing  Pertinent Gynecological History: Menses: flow is moderate Bleeding: No concerns Contraception: none DES exposure: denies Blood transfusions: none Sexually transmitted diseases: currently at risk Previous GYN Procedures: N/A  Last mammogram: N/A age 63  Last pap: N/A age 54    Past Medical History:  Diagnosis Date  . Headache    at her period   . Hypertension    as a child (had to diet to get bp down, no longer an issue)  . PTSD (post-traumatic stress disorder) 07/18/2016    Past Surgical History:  Procedure Laterality Date  . CHOLECYSTECTOMY N/A 02/08/2018   Procedure: LAPAROSCOPIC CHOLECYSTECTOMY;  Surgeon: Axel Filler, MD;  Location: [facility] OR;  Service: General;  Laterality: N/A;  . TONSILECTOMY, ADENOIDECTOMY, BILATERAL MYRINGOTOMY AND TUBES    . TONSILLECTOMY     age 61  . WISDOM TOOTH EXTRACTION      Family History  Problem Relation Age of Onset  . Other Mother        has a hole in her heart    Social History   Tobacco Use  . Smoking status: Never Smoker  . Smokeless tobacco: Never Used  Substance Use Topics  . Alcohol use: No  . Drug use: No    Allergies:  Allergies  Allergen Reactions  . Watermelon [Citrullus Vulgaris] Itching and Swelling    Makes lips swell and throat itch!! NO MELONS!!    Medications Prior to Admission  Medication Sig Dispense Refill Last Dose  . acetaminophen (TYLENOL) 325 MG tablet Take 650 mg by mouth every 6 (six)  hours as needed.   Unknown at Unknown time  . cetirizine (ZYRTEC) 10 MG tablet Take 10 mg by mouth at bedtime.  11 Unknown at Unknown time  . fluticasone (FLONASE) 50 MCG/ACT nasal spray Place 1 spray into both nostrils 2 (two) times daily. 16 g 12 Unknown at Unknown time  . ibuprofen (ADVIL,MOTRIN) 600 MG tablet Take 1 tablet (600 mg total) by mouth every 6 (six) hours as needed. 30 tablet 0 Unknown at Unknown time  . montelukast (SINGULAIR) 10 MG tablet Take 10 mg by mouth at bedtime.  11 Unknown at Unknown time  . ondansetron (ZOFRAN) 4 MG tablet Take 1 tablet (4 mg total) by mouth every 6 (six) hours. 12 tablet 0 Unknown at Unknown time  . pantoprazole (PROTONIX) 20 MG tablet Take 1 tablet (20 mg total) by mouth daily. 30 tablet 0 Unknown at Unknown time  . PATADAY 0.2 % SOLN Place 1 drop into both eyes daily. For 30 days  11 Unknown at Unknown time  . traMADol (ULTRAM) 50 MG tablet Take 1 tablet (50 mg total) by mouth every 6 (six) hours as needed. 20 tablet 0 Unknown at Unknown time    Review of Systems  Constitutional: Negative for chills and fever.  Respiratory: Negative for shortness of breath.   Gastrointestinal: Negative for abdominal pain, nausea and vomiting.  Genitourinary: Negative for difficulty urinating, dyspareunia,  dysuria, flank pain, frequency, vaginal bleeding, vaginal discharge and vaginal pain.       External vulvar irritation  Musculoskeletal: Negative for back pain.  Neurological: Negative for headaches.  All other systems reviewed and are negative.  Physical Exam   Blood pressure 125/80, pulse (!) 111, temperature 98.6 F (37 C), temperature source Oral, resp. rate 16, weight 133.8 kg, last menstrual period 08/26/2018, SpO2 100 %.  Physical Exam  Nursing note and vitals reviewed. Constitutional: She is oriented to person, place, and time. She appears well-developed and well-nourished.  Cardiovascular: Normal rate.  Respiratory: Effort normal.  GI: Soft. She  exhibits no distension. There is no tenderness. There is no rebound and no guarding.  Genitourinary:  Genitourinary Comments: No concerning signs on external assessment  Neurological: She is alert and oriented to person, place, and time. She has normal reflexes.  Skin: Skin is warm and dry.  Psychiatric: She has a normal mood and affect. Her behavior is normal. Judgment and thought content normal.    MAU Course  Procedures  MDM  Non-pregnant patient with vaginal irritation, no signs of external irritation on exam No concerning findings or complaints from patient Discussed that MAU does not typically offer pregnancy confirmation as standalone service, and not indicated today since patient had negative pregnancy test at urgent care yesterday   Patient Vitals for the past 24 hrs:  BP Temp Temp src Pulse Resp SpO2 Weight  09/20/18 1930 - - - - 20 - -  09/20/18 1807 125/80 - - - - - -  09/20/18 1805 - - - - 16 - 133.8 kg  09/20/18 1754 127/87 98.6 F (37 C) Oral (!) 111 18 100 % -    Results for orders placed or performed during the hospital encounter of 09/20/18 (from the past 24 hour(s))  Wet prep, genital     Status: Abnormal   Collection Time: 09/20/18  6:10 PM  Result Value Ref Range   Yeast Wet Prep HPF POC NONE SEEN NONE SEEN   Trich, Wet Prep NONE SEEN NONE SEEN   Clue Cells Wet Prep HPF POC PRESENT (A) NONE SEEN   WBC, Wet Prep HPF POC FEW (A) NONE SEEN   Sperm NONE SEEN   Urinalysis, Routine w reflex microscopic     Status: Abnormal   Collection Time: 09/20/18  6:10 PM  Result Value Ref Range   Color, Urine YELLOW YELLOW   APPearance HAZY (A) CLEAR   Specific Gravity, Urine 1.031 (H) 1.005 - 1.030   pH 6.0 5.0 - 8.0   Glucose, UA NEGATIVE NEGATIVE mg/dL   Hgb urine dipstick NEGATIVE NEGATIVE   Bilirubin Urine NEGATIVE NEGATIVE   Ketones, ur NEGATIVE NEGATIVE mg/dL   Protein, ur NEGATIVE NEGATIVE mg/dL   Nitrite NEGATIVE NEGATIVE   Leukocytes, UA NEGATIVE NEGATIVE    RBC / HPF 0-5 0 - 5 RBC/hpf   WBC, UA 6-10 0 - 5 WBC/hpf   Bacteria, UA FEW (A) NONE SEEN   Squamous Epithelial / LPF 11-20 0 - 5   Mucus PRESENT     Meds ordered this encounter  Medications  . metroNIDAZOLE (FLAGYL) 500 MG tablet    Sig: Take 1 tablet (500 mg total) by mouth 2 (two) times daily.    Dispense:  14 tablet    Refill:  0    Order Specific Question:   Supervising Provider    Answer:   Reva Bores [[id number]]    Assessment and Plan  --17 y.o.  female, negative pregnancy test yesterday at [facility] --Bacterial vaginosis, rx to patient pharmacy --Patient to review hygiene regimen for possible external irritants --Declined contraception counseling --Discharge home in stable condition  Calvert Cantor, CNM 09/20/2018, 7:35 PM

## 2018-09-21 LAB — GC/CHLAMYDIA PROBE AMP (~~LOC~~) NOT AT ARMC
Chlamydia: NEGATIVE
Neisseria Gonorrhea: NEGATIVE

## 2018-10-07 ENCOUNTER — Inpatient Hospital Stay: Payer: Medicaid Other | Admitting: Family Medicine

## 2018-10-27 ENCOUNTER — Encounter (HOSPITAL_COMMUNITY): Payer: Self-pay | Admitting: Emergency Medicine

## 2018-10-27 ENCOUNTER — Ambulatory Visit (HOSPITAL_COMMUNITY)
Admission: EM | Admit: 2018-10-27 | Discharge: 2018-10-27 | Disposition: A | Discharge status: Home / Self Care | Payer: Medicaid Other | Attending: Family Medicine | Admitting: Family Medicine

## 2018-10-27 DIAGNOSIS — J22 Unspecified acute lower respiratory infection: Secondary | ICD-10-CM | POA: Diagnosis not present

## 2018-10-27 MED ORDER — GUAIFENESIN-CODEINE 100-10 MG/5ML PO SOLN: 5.0000 mL | Freq: Three times a day (TID) | ORAL | 0 refills | Status: DC

## 2018-10-27 MED ORDER — PREDNISONE 50 MG PO TABS: 50.0000 mg | ORAL_TABLET | Freq: Every day | ORAL | 0 refills | Status: AC

## 2018-10-27 MED ORDER — CETIRIZINE HCL 10 MG PO CAPS: 10.0000 mg | ORAL_CAPSULE | Freq: Every day | ORAL | 0 refills | Status: DC

## 2018-10-27 MED ORDER — FLUTICASONE PROPIONATE 50 MCG/ACT NA SUSP: 1.0000 | Freq: Every day | NASAL | 0 refills | Status: DC

## 2018-10-27 MED ORDER — AZITHROMYCIN 250 MG PO TABS: 250.0000 mg | ORAL_TABLET | Freq: Every day | ORAL | 0 refills | Status: DC

## 2018-10-27 NOTE — Discharge Instructions (Signed)
Please begin taking azithromycin-2 tablets today, 1 tablet for the following 4 days to cover any atypical respiratory infection Please begin prednisone daily with food for the next 5 days to help with inflammation in chest Please begin daily Zyrtec and Flonase nasal spray 1 to 2 sprays in each nostril to help with congestion, drainage and ear discomfort May use cough syrup provided at bedtime or when you will be at home, do not use when needing to work or drive, may use Delsym or generic over-the-counter cough syrup during the day/work Please continue to monitor symptoms and follow-up if symptoms not resolving, developing shortness of breath, difficulty breathing, chest discomfort, fever

## 2018-10-27 NOTE — ED Triage Notes (Signed)
Pt c/o cough x1 month, pt states her ears also have been hurting.

## 2018-10-28 NOTE — ED Provider Notes (Signed)
[facility]    CSN: [id number] Arrival date & time: 10/27/18  1237     History   Chief Complaint Chief Complaint  Patient presents with  . Cough  . Otalgia    HPI Ebony Wilson is a 17 y.o. female no contributing past medical history presenting today for evaluation of URI symptoms.  Patient states that she has had a cough for 1 month.  Cough is been occasionally productive and occasionally dry.  Of recently she started to develop discomfort mainly in her right ear.  She denies any drainage or change in hearing.  She has had some mild congestion and rhinorrhea.  Mild sore throat.  Denies fevers.  She has tried over-the-counter medicines without relief.  Denies shortness of breath or chest discomfort.  HPI  Past Medical History:  Diagnosis Date  . Headache    at her period   . Hypertension    as a child (had to diet to get bp down, no longer an issue)  . PTSD (post-traumatic stress disorder) 07/18/2016    Patient Active Problem List   Diagnosis Date Noted  . Bacterial vaginosis 09/20/2018  . PTSD (post-traumatic stress disorder) 07/18/2016  . MDD (major depressive disorder), recurrent episode, severe (HCC) 07/17/2016    Past Surgical History:  Procedure Laterality Date  . CHOLECYSTECTOMY N/A 02/08/2018   Procedure: LAPAROSCOPIC CHOLECYSTECTOMY;  Surgeon: Axel Filler, MD;  Location: [facility];  Service: General;  Laterality: N/A;  . TONSILECTOMY, ADENOIDECTOMY, BILATERAL MYRINGOTOMY AND TUBES    . TONSILLECTOMY     age 24  . WISDOM TOOTH EXTRACTION      OB History   None      Home Medications    Prior to Admission medications   Medication Sig Start Date End Date Taking? Authorizing Provider  acetaminophen (TYLENOL) 325 MG tablet Take 650 mg by mouth every 6 (six) hours as needed.    [provider]  azithromycin (ZITHROMAX) 250 MG tablet Take 1 tablet (250 mg total) by mouth daily. Take first 2 tablets together, then 1 every day until  finished. 10/27/18   Tashiya Souders C, PA-C  Cetirizine HCl 10 MG CAPS Take 1 capsule (10 mg total) by mouth daily for 10 days. 10/27/18 11/06/18  Annalei Friesz C, PA-C  fluticasone (FLONASE) 50 MCG/ACT nasal spray Place 1-2 sprays into both nostrils daily for 7 days. 10/27/18 11/03/18  Herbert Aguinaldo C, PA-C  guaiFENesin-codeine 100-10 MG/5ML syrup Take 5 mLs by mouth every 8 (eight) hours. 10/27/18   Amour Trigg C, PA-C  montelukast (SINGULAIR) 10 MG tablet Take 10 mg by mouth at bedtime. 12/15/17   [provider]  ondansetron (ZOFRAN) 4 MG tablet Take 1 tablet (4 mg total) by mouth every 6 (six) hours. 01/11/18   Law, Alexandra M, PA-C  PATADAY 0.2 % SOLN Place 1 drop into both eyes daily. For 30 days 12/15/17   [provider]  predniSONE (DELTASONE) 50 MG tablet Take 1 tablet (50 mg total) by mouth daily with breakfast for 5 days. 10/27/18 11/01/18  Twana Wileman, Junius Creamer, PA-C    Family History Family History  Problem Relation Age of Onset  . Other Mother        has a hole in her heart    Social History Social History   Tobacco Use  . Smoking status: Never Smoker  . Smokeless tobacco: Never Used  Substance Use Topics  . Alcohol use: No  . Drug use: No     Allergies  Watermelon [citrullus vulgaris]   Review of Systems Review of Systems  Constitutional: Negative for activity change, appetite change, chills, fatigue and fever.  HENT: Positive for congestion, ear pain, rhinorrhea and sore throat. Negative for sinus pressure and trouble swallowing.   Eyes: Negative for discharge and redness.  Respiratory: Positive for cough. Negative for chest tightness and shortness of breath.   Cardiovascular: Negative for chest pain.  Gastrointestinal: Negative for abdominal pain, diarrhea, nausea and vomiting.  Musculoskeletal: Negative for myalgias.  Skin: Negative for rash.  Neurological: Negative for dizziness, light-headedness and headaches.     Physical  Exam Triage Vital Signs ED Triage Vitals  Enc Vitals Group     BP 10/27/18 1408 114/81     Pulse Rate 10/27/18 1407 75     Resp 10/27/18 1407 16     Temp 10/27/18 1406 98.2 F (36.8 C)     Temp src --      SpO2 10/27/18 1407 100 %     Weight --      Height --      Head Circumference --      Peak Flow --      Pain Score 10/27/18 1407 10     Pain Loc --      Pain Edu? --      Excl. in GC? --    No data found.  Updated Vital Signs BP 114/81   Pulse 75   Temp 98.2 F (36.8 C)   Resp 16   LMP 10/06/2018   SpO2 100%   Visual Acuity Right Eye Distance:   Left Eye Distance:   Bilateral Distance:    Right Eye Near:   Left Eye Near:    Bilateral Near:     Physical Exam  Constitutional: She appears well-developed and well-nourished. No distress.  HENT:  Head: Normocephalic and atraumatic.  Bilateral ears without tenderness to palpation of external auricle, tragus and mastoid, EAC's without erythema or swelling, TM's with good bony landmarks and cone of light. Non erythematous, right TM appears opaque  Oral mucosa pink and moist, no tonsillar enlargement or exudate. Posterior pharynx patent and nonerythematous, no uvula deviation or swelling. Normal phonation.  Eyes: Conjunctivae are normal.  Neck: Neck supple.  Cardiovascular: Normal rate and regular rhythm.  No murmur heard. Pulmonary/Chest: Effort normal and breath sounds normal. No respiratory distress.  Breathing comfortably at rest, CTABL, no wheezing, rales or other adventitious sounds auscultated  Abdominal: Soft. There is no tenderness.  Musculoskeletal: She exhibits no edema.  Neurological: She is alert.  Skin: Skin is warm and dry.  Psychiatric: She has a normal mood and affect.  Nursing note and vitals reviewed.    UC Treatments / Results  Labs (all labs ordered are listed, but only abnormal results are displayed) Labs Reviewed - No data to display  EKG None  Radiology No results  found.  Procedures Procedures (including critical care time)  Medications Ordered in UC Medications - No data to display  Initial Impression / Assessment and Plan / UC Course  I have reviewed the triage vital signs and the nursing notes.  Pertinent labs & imaging results that were available during my care of the patient were reviewed by me and considered in my medical decision making (see chart for details).     Patient with cough x1 month, will cover for atypical respiratory illness with azithromycin given length of symptoms.  Prednisone to help with cough and inflammation in chest, Zyrtec and Flonase to  help with nasal congestion and any effusion and ear contributing to discomfort.  No sign of otitis media.  Provided Robitussin with codeine to use only at nighttime, recommended continued over-the-counter syrups during the day.  Continue to monitor cough, breathing and temperature, follow-up if symptoms not resolving with recommendations.Discussed strict return precautions. Patient verbalized understanding and is agreeable with plan.  Final Clinical Impressions(s) / UC Diagnoses   Final diagnoses:  Lower respiratory infection (e.g., bronchitis, pneumonia, pneumonitis, pulmonitis)     Discharge Instructions     Please begin taking azithromycin-2 tablets today, 1 tablet for the following 4 days to cover any atypical respiratory infection Please begin prednisone daily with food for the next 5 days to help with inflammation in chest Please begin daily Zyrtec and Flonase nasal spray 1 to 2 sprays in each nostril to help with congestion, drainage and ear discomfort May use cough syrup provided at bedtime or when you will be at home, do not use when needing to work or drive, may use Delsym or generic over-the-counter cough syrup during the day/work Please continue to monitor symptoms and follow-up if symptoms not resolving, developing shortness of breath, difficulty breathing, chest  discomfort, fever   ED Prescriptions    Medication Sig Dispense Auth. Provider   azithromycin (ZITHROMAX) 250 MG tablet Take 1 tablet (250 mg total) by mouth daily. Take first 2 tablets together, then 1 every day until finished. 6 tablet Shamel Galyean C, PA-C   predniSONE (DELTASONE) 50 MG tablet Take 1 tablet (50 mg total) by mouth daily with breakfast for 5 days. 5 tablet Aalia Greulich C, PA-C   Cetirizine HCl 10 MG CAPS Take 1 capsule (10 mg total) by mouth daily for 10 days. 10 capsule Alisah Grandberry C, PA-C   fluticasone (FLONASE) 50 MCG/ACT nasal spray Place 1-2 sprays into both nostrils daily for 7 days. 1 g Luvena Wentling C, PA-C   guaiFENesin-codeine 100-10 MG/5ML syrup Take 5 mLs by mouth every 8 (eight) hours. 60 mL Junetta Hearn C, PA-C     Controlled Substance Prescriptions [place] Controlled Substance Registry consulted? Not Applicable   Lew Dawes, [place] 10/28/18 [phone]

## 2018-10-29 ENCOUNTER — Encounter (HOSPITAL_COMMUNITY): Payer: Self-pay | Admitting: Oncology

## 2018-10-29 ENCOUNTER — Emergency Department (HOSPITAL_COMMUNITY): Payer: Medicaid Other

## 2018-10-29 ENCOUNTER — Other Ambulatory Visit: Payer: Self-pay

## 2018-10-29 ENCOUNTER — Emergency Department (HOSPITAL_COMMUNITY)
Admission: EM | Admit: 2018-10-29 | Discharge: 2018-10-30 | Disposition: A | Discharge status: Home / Self Care | Payer: Medicaid Other | Attending: Emergency Medicine | Admitting: Emergency Medicine

## 2018-10-29 DIAGNOSIS — J209 Acute bronchitis, unspecified: Secondary | ICD-10-CM | POA: Insufficient documentation

## 2018-10-29 DIAGNOSIS — Z79899 Other long term (current) drug therapy: Secondary | ICD-10-CM | POA: Diagnosis not present

## 2018-10-29 DIAGNOSIS — J40 Bronchitis, not specified as acute or chronic: Secondary | ICD-10-CM

## 2018-10-29 DIAGNOSIS — R05 Cough: Secondary | ICD-10-CM | POA: Diagnosis present

## 2018-10-29 NOTE — ED Provider Notes (Signed)
TIME SEEN: 11:29 PM  CHIEF COMPLAINT: Cough  HPI: Patient is an 17 year old female with history of obesity who presents to the emergency department with 1 month of nonproductive cough, ear pain, sore throat.  Was seen at urgent care on 10/27/2018 and given azithromycin, prednisone, cough syrup.  States symptoms are not improving and that is why she presented to the ED.  No fever.  No recent travel or sick contact.  No history of PE, DVT, exogenous estrogen use, recent fractures, surgery, trauma, hospitalization or prolonged travel. No lower extremity swelling or pain. No calf tenderness.   ROS: See HPI Constitutional: no fever  Eyes: no drainage  ENT: no runny nose   Cardiovascular:  no chest pain  Resp: no SOB  GI: no vomiting GU: no dysuria Integumentary: no rash  Allergy: no hives  Musculoskeletal: no leg swelling  Neurological: no slurred speech ROS otherwise negative  PAST MEDICAL HISTORY/PAST SURGICAL HISTORY:  Past Medical History:  Diagnosis Date  . Headache    at her period   . Hypertension    as a child (had to diet to get bp down, no longer an issue)  . PTSD (post-traumatic stress disorder) 07/18/2016    MEDICATIONS:  Prior to Admission medications   Medication Sig Start Date End Date Taking? Authorizing Provider  acetaminophen (TYLENOL) 325 MG tablet Take 650 mg by mouth every 6 (six) hours as needed.    [provider]  azithromycin (ZITHROMAX) 250 MG tablet Take 1 tablet (250 mg total) by mouth daily. Take first 2 tablets together, then 1 every day until finished. 10/27/18   Wieters, Hallie C, PA-C  Cetirizine HCl 10 MG CAPS Take 1 capsule (10 mg total) by mouth daily for 10 days. 10/27/18 11/06/18  Wieters, Hallie C, PA-C  fluticasone (FLONASE) 50 MCG/ACT nasal spray Place 1-2 sprays into both nostrils daily for 7 days. 10/27/18 11/03/18  Wieters, Hallie C, PA-C  guaiFENesin-codeine 100-10 MG/5ML syrup Take 5 mLs by mouth every 8 (eight) hours. 10/27/18    Wieters, Hallie C, PA-C  montelukast (SINGULAIR) 10 MG tablet Take 10 mg by mouth at bedtime. 12/15/17   [provider]  ondansetron (ZOFRAN) 4 MG tablet Take 1 tablet (4 mg total) by mouth every 6 (six) hours. 01/11/18   Law, Alexandra M, PA-C  PATADAY 0.2 % SOLN Place 1 drop into both eyes daily. For 30 days 12/15/17   [provider]  predniSONE (DELTASONE) 50 MG tablet Take 1 tablet (50 mg total) by mouth daily with breakfast for 5 days. 10/27/18 11/01/18  Wieters, Hallie C, PA-C    ALLERGIES:  Allergies  Allergen Reactions  . Watermelon [Citrullus Vulgaris] Itching and Swelling    Makes lips swell and throat itch!! NO MELONS!!    SOCIAL HISTORY:  Social History   Tobacco Use  . Smoking status: Never Smoker  . Smokeless tobacco: Never Used  Substance Use Topics  . Alcohol use: No    FAMILY HISTORY: Family History  Problem Relation Age of Onset  . Other Mother        has a hole in her heart    EXAM: BP 119/80 (BP Location: Right Arm)   Pulse (!) 106   Temp 98.5 F (36.9 C) (Oral)   Resp (!) 22   Ht 5' 5.5" (1.664 m)   Wt 133 kg   LMP 10/06/2018   SpO2 100%   BMI 48.05 kg/m  CONSTITUTIONAL: Alert and oriented and responds appropriately to questions. Well-appearing; well-nourished  HEAD: Normocephalic EYES: Conjunctivae clear, pupils appear equal, EOMI ENT: normal nose; moist mucous membranes; No pharyngeal erythema or petechiae, no tonsillar hypertrophy or exudate, no uvular deviation, no unilateral swelling, no trismus or drooling, no muffled voice, normal phonation, no stridor, no dental caries present, no drainable dental abscess noted, no Ludwig's angina, tongue sits flat in the bottom of the mouth, no angioedema, no facial erythema or warmth, no facial swelling; no pain with movement of the neck.  TMs are clear bilaterally without erythema, purulence, bulging, perforation, effusion.  No cerumen impaction or sign of foreign body in the external auditory  canal. No inflammation, erythema or drainage from the external auditory canal. No signs of mastoiditis. No pain with manipulation of the pinna bilaterally. NECK: Supple, no meningismus, no nuchal rigidity, no LAD  CARD: Ocular and minimally tachycardic; S1 and S2 appreciated; no murmurs, no clicks, no rubs, no gallops RESP: Normal chest excursion without splinting or tachypnea; breath sounds clear and equal bilaterally; no wheezes, no rhonchi, no rales, no hypoxia or respiratory distress, speaking full sentences ABD/GI: Normal bowel sounds; non-distended; soft, non-tender, no rebound, no guarding, no peritoneal signs, no hepatosplenomegaly BACK:  The back appears normal and is non-tender to palpation, there is no CVA tenderness EXT: Normal ROM in all joints; non-tender to palpation; no edema; normal capillary refill; no cyanosis, no calf tenderness or swelling    SKIN: Normal color for age and race; warm; no rash NEURO: Moves all extremities equally PSYCH: The patient's mood and manner are appropriate. Grooming and personal hygiene are appropriate.  MEDICAL DECISION MAKING: Patient here with URI symptoms.  She is already on azithromycin, prednisone and cough syrup.  Lungs are currently clear at this time.  She is slightly tachycardic on examination but has no risk factors for pulmonary embolus and to me denies chest pain or shortness of breath.  Will obtain chest x-ray, EKG for further evaluation.  Anticipate discharge home if work-up unremarkable with symptomatic management.  ED PROGRESS: Chest x-ray shows airway thickening consistent with bronchitis versus reactive airway disease.  Have advised her to continue her azithromycin, prednisone and will discharge with albuterol inhaler.  EKG shows no ischemic changes.  Discussed return precautions.  Patient comfortable with this plan.   At this time, I do not feel there is any life-threatening condition present. I have reviewed and discussed all results  (EKG, imaging, lab, urine as appropriate) and exam findings with patient/family. I have reviewed nursing notes and appropriate previous records.  I feel the patient is safe to be discharged home without further emergent workup and can continue workup as an outpatient as needed. Discussed usual and customary return precautions. Patient/family verbalize understanding and are comfortable with this plan.  Outpatient follow-up has been provided if needed. All questions have been answered.      EKG Interpretation  Date/Time:  Saturday October 29 2018 23:38:36 EST Ventricular Rate:  107 PR Interval:    QRS Duration: 86 QT Interval:  342 QTC Calculation: 457 R Axis:   75 Text Interpretation:  Sinus tachycardia No significant change since last tracing other than rate is faster Confirmed by Kallee Nam, Baxter Hire [phone]) on 10/29/2018 11:46:21 PM         Jurline Folger, Layla Maw, DO 10/30/18 0028

## 2018-10-29 NOTE — ED Triage Notes (Signed)
Pt c/o cough and shob x one month.  States went to urgent care last week and was rx antibiotics.  Pt reports she doesn't feel better yet.

## 2018-10-30 MED ORDER — ALBUTEROL SULFATE HFA 108 (90 BASE) MCG/ACT IN AERS
2.0000 | INHALATION_SPRAY | Freq: Once | RESPIRATORY_TRACT | Status: AC
  Administered 2018-10-30: 2 via RESPIRATORY_TRACT
  Filled 2018-10-30: qty 6.7

## 2018-10-30 NOTE — ED Notes (Signed)
Patient verbalizes understanding of discharge instructions. Opportunity for questioning and answers were provided. Armband removed by staff, pt discharged from ED ambulatory.   

## 2018-10-30 NOTE — Discharge Instructions (Addendum)
Please continue your medications as prescribed.  You may use albuterol inhaler 2 to 4 puffs every 4 hours as needed for shortness of breath.  You may alternate Tylenol 1000 mg every 6 hours as needed for pain and Ibuprofen 800 mg every 8 hours as needed for pain.  Please take Ibuprofen with food.

## 2018-11-09 ENCOUNTER — Emergency Department (HOSPITAL_COMMUNITY): Payer: Medicaid Other

## 2018-11-09 ENCOUNTER — Emergency Department (HOSPITAL_COMMUNITY)
Admission: EM | Admit: 2018-11-09 | Discharge: 2018-11-09 | Disposition: A | Discharge status: Home / Self Care | Payer: Medicaid Other | Attending: Emergency Medicine | Admitting: Emergency Medicine

## 2018-11-09 ENCOUNTER — Encounter (HOSPITAL_COMMUNITY): Payer: Self-pay | Admitting: Emergency Medicine

## 2018-11-09 DIAGNOSIS — R05 Cough: Secondary | ICD-10-CM | POA: Diagnosis not present

## 2018-11-09 DIAGNOSIS — I10 Essential (primary) hypertension: Secondary | ICD-10-CM | POA: Insufficient documentation

## 2018-11-09 DIAGNOSIS — Z79899 Other long term (current) drug therapy: Secondary | ICD-10-CM | POA: Diagnosis not present

## 2018-11-09 DIAGNOSIS — H9201 Otalgia, right ear: Secondary | ICD-10-CM | POA: Insufficient documentation

## 2018-11-09 DIAGNOSIS — R053 Chronic cough: Secondary | ICD-10-CM

## 2018-11-09 DIAGNOSIS — R030 Elevated blood-pressure reading, without diagnosis of hypertension: Secondary | ICD-10-CM

## 2018-11-09 MED ORDER — MONTELUKAST SODIUM 10 MG PO TABS: 10.0000 mg | ORAL_TABLET | Freq: Every day | ORAL | 1 refills | Status: DC

## 2018-11-09 MED ORDER — PANTOPRAZOLE SODIUM 20 MG PO TBEC: 20.0000 mg | DELAYED_RELEASE_TABLET | Freq: Every day | ORAL | 0 refills | Status: DC

## 2018-11-09 NOTE — Discharge Instructions (Addendum)
Please see the information and instructions below regarding your visit.  Your diagnoses today include:  1. Chronic cough   2. Right ear pain   3. Elevated blood pressure reading without diagnosis of hypertension    There are many causes of chronic cough.  These include acid reflux, postnasal drip due to allergies, and asthma.  It is most important that you follow-up with a primary care provider to help you sort out what is the cause of your cough.  Tests performed today include: See side panel of your discharge paperwork for testing performed today. Vital signs are listed at the bottom of these instructions.   Your chest x-ray looks normal today.  Medications prescribed:    Take any prescribed medications only as prescribed, and any over the counter medications only as directed on the packaging.  Please start taking Protonix, 20 mg in the morning.  Please only take this for 30 days.  Please continue to use your Flonase, 1 spray in each nostril in the morning.  Please start taking montelukast daily at bedtime.  Home care instructions:  Please follow any educational materials contained in this packet.   Follow-up instructions: Please follow-up with your primary care provider as soon as possible for further evaluation of your symptoms if they are not completely improved.  You may be assigned a primary care provider on the back of your Medicaid card.  Please look at this.  To find a primary care or specialty doctor please call [phone] or [phone] to access "[place] Find a Doctor Service."  You may also go on the [facility] website at [email or web address]  There are also multiple [facility] practices throughout the [place] that are frequently accepting new patients. You may find a clinic that is close to your home and contact them.  [facility] - [address] [phone]  [facility] in [place] (also locations in [place] and [place]) - [address] [place] [phone]  [facility] - [address] [phone]    Return instructions:  Please return to the Emergency Department if you experience worsening symptoms.  Return to the emergency department for any fever or chills with your symptoms, difficulty breathing, feeling short of breath or having pain with taking deep breaths, or feeling dizzy or lightheaded like you are going to pass out. Please return if you have any other emergent concerns.  Additional Information:   Your vital signs today were: BP (!) 151/73 (BP Location: Right Arm)    Pulse 93    Temp 98.6 F (37 C) (Oral)    Resp 18    LMP 11/09/2018    SpO2 99%  If your blood pressure (BP) was elevated on multiple readings during this visit above 130 for the top number or above 80 for the bottom number, please have this repeated by your primary care provider within one month. --------------  Thank you for allowing us to participate in your care today.

## 2018-11-09 NOTE — ED Triage Notes (Signed)
Pt presents to ED for persistent cough, bilateral rib cage pain.  States she has had "walking pneumonia" and "bronchitis" in the past three weeks.  States inhaler is not helping, she is still coughing a lot.

## 2018-11-09 NOTE — ED Notes (Signed)
Patient did not answer when called x 3.   Will room next appropriate patient and then call for patient again with next available room.

## 2018-11-09 NOTE — ED Notes (Signed)
Pt verbalizes understanding of d/c instructions. Prescriptions reviewed with patient. Pt ambulatory at d/c with all belongings.  

## 2018-11-09 NOTE — ED Provider Notes (Signed)
[facility] EMERGENCY DEPARTMENT Provider Note   CSN: [id number] Arrival date & time: 11/09/18  1847     History   Chief Complaint Chief Complaint  Patient presents with  . Cough  . Otalgia    HPI Ebony Wilson is a 17 y.o. female.  HPI  Patient is an 17 year old female with a history of headache, hypertension, and PTSD presenting for cough and otalgia on the right.  Patient reports that she has had a cough for approximately 2 months, mostly nonproductive, with occasional streaks of blood.  Patient reports that she is in been evaluated twice for this, first diagnosed with "walking pneumonia", followed by bronchitis.  Patient denies any history of reactive airway disease.  Patient reports that over the same interval, she has had a fullness and dull ache in her right ear.  Denies drainage.  Reports she was treated for "ear infection" at the onset of her symptoms.  Patient denies any fevers, chills, sore throat, congestion, rhinorrhea, nausea, vomiting, chest pain or substernal chest burning.  Patient denies any history of DVT/PE, hormone use, cancer treatment, recent immobilization, hospitalization, recent surgery, or family history of DVT/PE.  Patient reports that the inhaler she was prescribed does not work, and she self discontinued her allergy medications.  Past Medical History:  Diagnosis Date  . Headache    at her period   . Hypertension    as a child (had to diet to get bp down, no longer an issue)  . PTSD (post-traumatic stress disorder) 07/18/2016    Patient Active Problem List   Diagnosis Date Noted  . Bacterial vaginosis 09/20/2018  . PTSD (post-traumatic stress disorder) 07/18/2016  . MDD (major depressive disorder), recurrent episode, severe (HCC) 07/17/2016    Past Surgical History:  Procedure Laterality Date  . CHOLECYSTECTOMY N/A 02/08/2018   Procedure: LAPAROSCOPIC CHOLECYSTECTOMY;  Surgeon: Axel Filler, MD;  [facility]: [facility];  Service:  General;  Laterality: N/A;  . TONSILECTOMY, ADENOIDECTOMY, BILATERAL MYRINGOTOMY AND TUBES    . TONSILLECTOMY     age 27  . WISDOM TOOTH EXTRACTION       OB History   None      Home Medications    Prior to Admission medications   Medication Sig Start Date End Date Taking? Authorizing Provider  acetaminophen (TYLENOL) 325 MG tablet Take 650 mg by mouth every 6 (six) hours as needed.    [provider]  azithromycin (ZITHROMAX) 250 MG tablet Take 1 tablet (250 mg total) by mouth daily. Take first 2 tablets together, then 1 every day until finished. 10/27/18   Wieters, Hallie C, PA-C  Cetirizine HCl 10 MG CAPS Take 1 capsule (10 mg total) by mouth daily for 10 days. 10/27/18 11/06/18  Wieters, Hallie C, PA-C  fluticasone (FLONASE) 50 MCG/ACT nasal spray Place 1-2 sprays into both nostrils daily for 7 days. 10/27/18 11/03/18  Wieters, Hallie C, PA-C  guaiFENesin-codeine 100-10 MG/5ML syrup Take 5 mLs by mouth every 8 (eight) hours. 10/27/18   Wieters, Hallie C, PA-C  montelukast (SINGULAIR) 10 MG tablet Take 10 mg by mouth at bedtime. 12/15/17   [provider]  ondansetron (ZOFRAN) 4 MG tablet Take 1 tablet (4 mg total) by mouth every 6 (six) hours. 01/11/18   Law, Alexandra M, PA-C  PATADAY 0.2 % SOLN Place 1 drop into both eyes daily. For 30 days 12/15/17   [provider]    Family History Family History  Problem Relation Age of Onset  .  Other Mother        has a hole in her heart    Social History Social History   Tobacco Use  . Smoking status: Never Smoker  . Smokeless tobacco: Never Used  Substance Use Topics  . Alcohol use: No  . Drug use: No     Allergies   Watermelon [citrullus vulgaris]   Review of Systems Review of Systems  Constitutional: Negative for chills and fever.  HENT: Positive for ear pain. Negative for congestion, rhinorrhea, sore throat, trouble swallowing and voice change.   Respiratory: Positive for cough.     Cardiovascular: Negative for chest pain.     Physical Exam Updated Vital Signs BP (!) 151/73 (BP Location: Right Arm)   Pulse 93   Temp 98.6 F (37 C) (Oral)   Resp 18   LMP 11/09/2018   SpO2 99%   Physical Exam  Constitutional: She appears well-developed and well-nourished. No distress.  Sitting comfortably in bed.  HENT:  Head: Normocephalic and atraumatic.  Right Ear: External ear normal.  Left Ear: External ear normal.  Right tympanic membrane with scarring on the anterior and posterior aspects.  Serous effusion.  No erythema. Left tympanic membrane with good visualization of bony landmarks.  No erythema or effusion.  Eyes: Conjunctivae are normal. Right eye exhibits no discharge. Left eye exhibits no discharge.  EOMs normal to gross examination.  Neck: Normal range of motion. Neck supple. No JVD present.  Cardiovascular: Normal rate and regular rhythm.  Intact, 2+ radial pulse.  Pulmonary/Chest: Effort normal and breath sounds normal. She has no wheezes. She has no rales.  Normal respiratory effort. Patient converses comfortably. No audible wheeze or stridor.  Abdominal: She exhibits no distension.  Musculoskeletal: Normal range of motion.  Lymphadenopathy:    She has no cervical adenopathy.  Neurological: She is alert.  Cranial nerves intact to gross observation. Patient moves extremities without difficulty.  Skin: Skin is warm and dry. She is not diaphoretic.  Psychiatric: She has a normal mood and affect. Her behavior is normal. Judgment and thought content normal.  Nursing note and vitals reviewed.    ED Treatments / Results  Labs (all labs ordered are listed, but only abnormal results are displayed) Labs Reviewed - No data to display  EKG None  Radiology Dg Chest 2 View  Result Date: 11/09/2018 CLINICAL DATA:  Cough and congestion.  Chest pain. EXAM: CHEST - 2 VIEW COMPARISON:  October 29, 2018 FINDINGS: The heart size and mediastinal contours are  within normal limits. Both lungs are clear. The visualized skeletal structures are unremarkable. IMPRESSION: No active cardiopulmonary disease. Electronically Signed   By: Gerome Sam III M.D   On: 11/09/2018 19:52    Procedures Procedures (including critical care time)  Medications Ordered in ED Medications - No data to display   Initial Impression / Assessment and Plan / ED Course  I have reviewed the triage vital signs and the nursing notes.  Pertinent labs & imaging results that were available during my care of the patient were reviewed by me and considered in my medical decision making (see chart for details).     Patient nontoxic-appearing, afebrile, without tachypnea, tachycardia nor hypoxia.  Patient with approximately 61-month cough for which she is received multiple emergency department evaluations.  Patient denies any increase in her symptoms today other than she is having persistent lingering cough that is bothersome to her.  She is also having persistent right otalgia.  Pulmonary exam is unremarkable.  Patient has no wheezing or adventitious lung sounds.  Patient has normal chest x-ray, reviewed by me, without evidence of mediastinal adenopathy or other acute pulmonary abnormalities.  Do not suspect pulmonary embolism, as patient has normal and stable vital signs, and patient is not having any chest pain or shortness of breath with her symptoms.  Exam of the right ear demonstrates serous effusion, but no evidence of otitis media.  I discussed with the patient that she is in need of primary care provider to assist in further management of chronic cough.  I discussed with the patient the most common causes of chronic cough, and recommended that she reinitiate her allergic rhinitis therapy.  Patient denies any overt symptoms of GERD, but I recommend a 30-day trial of Protonix to see if reflux is possible cause of her chronic cough.  Recommended finding primary care provider within  this 30-day period.  Do not suspect acute cardiopulmonary condition warranting further imaging or studies today.  Return precautions were given to patient for any fevers with her symptoms, chest pain, shortness of breath, dizziness, lightheadedness, syncope or presyncope.  Patient is in understanding and agrees with the plan of care.  Final Clinical Impressions(s) / ED Diagnoses   Final diagnoses:  Chronic cough  Right ear pain  Elevated blood pressure reading without diagnosis of hypertension    ED Discharge Orders         Ordered    montelukast (SINGULAIR) 10 MG tablet  Daily at bedtime     11/09/18 2116    pantoprazole (PROTONIX) 20 MG tablet  Daily     11/09/18 2116           Elisha PonderMurray, Deeksha Cotrell B, PA-C 11/10/18 0054    Derwood KaplanNanavati, Ankit, MD 11/10/18 [phone]

## 2018-11-16 ENCOUNTER — Ambulatory Visit (HOSPITAL_COMMUNITY)
Admission: EM | Admit: 2018-11-16 | Discharge: 2018-11-16 | Disposition: A | Discharge status: Home / Self Care | Payer: Medicaid Other | Attending: Emergency Medicine | Admitting: Emergency Medicine

## 2018-11-16 ENCOUNTER — Encounter (HOSPITAL_COMMUNITY): Payer: Self-pay | Admitting: Emergency Medicine

## 2018-11-16 DIAGNOSIS — R05 Cough: Secondary | ICD-10-CM | POA: Insufficient documentation

## 2018-11-16 DIAGNOSIS — R000 Tachycardia, unspecified: Secondary | ICD-10-CM | POA: Diagnosis not present

## 2018-11-16 DIAGNOSIS — Z791 Long term (current) use of non-steroidal anti-inflammatories (NSAID): Secondary | ICD-10-CM | POA: Insufficient documentation

## 2018-11-16 DIAGNOSIS — Z79899 Other long term (current) drug therapy: Secondary | ICD-10-CM | POA: Insufficient documentation

## 2018-11-16 DIAGNOSIS — R053 Chronic cough: Secondary | ICD-10-CM

## 2018-11-16 DIAGNOSIS — R062 Wheezing: Secondary | ICD-10-CM | POA: Insufficient documentation

## 2018-11-16 DIAGNOSIS — J309 Allergic rhinitis, unspecified: Secondary | ICD-10-CM | POA: Diagnosis not present

## 2018-11-16 DIAGNOSIS — R0781 Pleurodynia: Secondary | ICD-10-CM | POA: Diagnosis present

## 2018-11-16 LAB — D-DIMER, QUANTITATIVE: D-Dimer, Quant: 0.4 ug/mL-FEU (ref 0.00–0.50)

## 2018-11-16 MED ORDER — IBUPROFEN 600 MG PO TABS: 600.0000 mg | ORAL_TABLET | Freq: Four times a day (QID) | ORAL | 0 refills | Status: DC | PRN

## 2018-11-16 MED ORDER — ALBUTEROL SULFATE HFA 108 (90 BASE) MCG/ACT IN AERS: 1.0000 | INHALATION_SPRAY | Freq: Four times a day (QID) | RESPIRATORY_TRACT | 0 refills | Status: DC | PRN

## 2018-11-16 MED ORDER — FAMOTIDINE 20 MG PO TABS: 20.0000 mg | ORAL_TABLET | Freq: Two times a day (BID) | ORAL | 0 refills | Status: DC

## 2018-11-16 MED ORDER — AEROCHAMBER PLUS MISC: 2 refills | Status: DC

## 2018-11-16 MED ORDER — BENZONATATE 200 MG PO CAPS: 200.0000 mg | ORAL_CAPSULE | Freq: Three times a day (TID) | ORAL | 0 refills | Status: DC | PRN

## 2018-11-16 NOTE — ED Notes (Signed)
Patient able to ambulate independently  

## 2018-11-16 NOTE — ED Provider Notes (Signed)
HPI  SUBJECTIVE:  Ebony Wilson is a 17 y.o. female who presents with 2 months of a persistent cough, wheezing, sharp, substernal and lower rib pain that is present with coughing only.  She reports pleuritic pain along both of her lower ribs.  She reports shortness of breath with coughing only.  She states that the cough is occasionally productive of white mucus.  She denies hemoptysis, dyspnea on exertion.  No fevers.  No nasal congestion, sinus pain or pressure, rhinorrhea, postnasal drip, allergy symptoms.  No GERD symptoms.  No calf pain, swelling.  No OCP use.  No surgery in the past 4 weeks, prolonged immobilization. Initially seen here on 11/21 for 1 month of a cough treated with azithromycin, prednisone, Zyrtec, Flonase, Robitussin with codeine.  Then seen 2 days later in the ER for the same, EKG, chest x-ray were normal.  PE was felt to be low in the differential.  Was discharged with an albuterol inhaler.  Again seen in the ER on 12/4 for this.  Was thought to have a chronic cough possibly due to allergic rhinitis was advised to restart therapy,  was started on Singulair, and was also started on Protonix to see if reflux was a possible cause.  PE again thought to be low in the differential.  States that she took the Protonix for about a week but then stopped due to lip itching and hives.  She has been using the albuterol, she does not have a spacer.  She is also currently using Flonase.  She has also tried albuterol, prednisone, azithromycin, cough syrup, and Singulair.  She states that the cough is slightly better with albuterol, worse with lying down, with exposure to cold air pulmonary irritants.  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  Medical history of allergies.  No history of cancer, DVT, PE, GERD, smoking, asthma, eczema, COPD, hypertension.  LMP: Last week.  Denies the possibility of being pregnant.  PMD: None.  Past Medical History:  Diagnosis Date  .  Headache    at her period   . Hypertension    as a child (had to diet to get bp down, no longer an issue)  . PTSD (post-traumatic stress disorder) 07/18/2016    Past Surgical History:  Procedure Laterality Date  . CHOLECYSTECTOMY N/A 02/08/2018   Procedure: LAPAROSCOPIC CHOLECYSTECTOMY;  Surgeon: Axel Filler, MD;  Location: [facility] OR;  Service: General;  Laterality: N/A;  . TONSILECTOMY, ADENOIDECTOMY, BILATERAL MYRINGOTOMY AND TUBES    . TONSILLECTOMY     age 48  . WISDOM TOOTH EXTRACTION      Family History  Problem Relation Age of Onset  . Other Mother        has a hole in her heart    Social History   Tobacco Use  . Smoking status: Never Smoker  . Smokeless tobacco: Never Used  Substance Use Topics  . Alcohol use: No  . Drug use: No    No current facility-administered medications for this encounter.   Current Outpatient Medications:  .  acetaminophen (TYLENOL) 325 MG tablet, Take 650 mg by mouth every 6 (six) hours as needed., Disp: , Rfl:  .  albuterol (PROVENTIL HFA;VENTOLIN HFA) 108 (90 Base) MCG/ACT inhaler, Inhale 1-2 puffs into the lungs every 6 (six) hours as needed for wheezing or shortness of breath., Disp: 1 Inhaler, Rfl: 0 .  benzonatate (TESSALON) 200 MG capsule, Take 1 capsule (200 mg total) by mouth 3 (three) times  daily as needed for cough., Disp: 30 capsule, Rfl: 0 .  Cetirizine HCl 10 MG CAPS, Take 1 capsule (10 mg total) by mouth daily for 10 days., Disp: 10 capsule, Rfl: 0 .  famotidine (PEPCID) 20 MG tablet, Take 1 tablet (20 mg total) by mouth 2 (two) times daily., Disp: 40 tablet, Rfl: 0 .  fluticasone (FLONASE) 50 MCG/ACT nasal spray, Place 1-2 sprays into both nostrils daily for 7 days., Disp: 1 g, Rfl: 0 .  ibuprofen (ADVIL,MOTRIN) 600 MG tablet, Take 1 tablet (600 mg total) by mouth every 6 (six) hours as needed., Disp: 30 tablet, Rfl: 0 .  montelukast (SINGULAIR) 10 MG tablet, Take 1 tablet (10 mg total) by mouth at bedtime., Disp: 30 tablet,  Rfl: 1 .  Spacer/Aero-Holding Chambers (AEROCHAMBER PLUS) inhaler, Use as instructed, Disp: 1 each, Rfl: 2  Allergies  Allergen Reactions  . Watermelon [Citrullus Vulgaris] Itching and Swelling    Makes lips swell and throat itch!! NO MELONS!!     ROS  As noted in HPI.   Physical Exam  BP 111/72 (BP Location: Left Arm)   Pulse (!) 116   Temp 99 F (37.2 C) (Oral)   Resp 18   LMP 11/09/2018   SpO2 98%   Constitutional: Well developed, well nourished, no acute distress Eyes:  EOMI, conjunctiva normal bilaterally HENT: Normocephalic, atraumatic,mucus membranes moist.  Normal nasal mucosa.  No nasal congestion.  No maxillary or frontal sinus tenderness.  Normal oropharynx.  Positive cobblestoning.  No obvious postnasal drip. Respiratory: Normal inspiratory effort, good air movement, lungs clear bilaterally.  Positive anterior and lateral chest wall tenderness.  This reproduces her chest pain. Cardiovascular: regular tachycardia, no murmurs, rubs, gallops GI: nondistended skin: No rash, skin intact Musculoskeletal: Calves symmetric, nontender, no edema. Neurologic: Alert & oriented x 3, no focal neuro deficits Psychiatric: Speech and behavior appropriate   ED Course   Medications - No data to display  Orders Placed This Encounter  Procedures  . D-dimer, quantitative (not at [facility])    Standing Status:   Standing    Number of Occurrences:   1    No results found for this or any previous visit (from the past 24 hour(s)). No results found.  ED Clinical Impression  Chronic cough   ED Assessment/Plan  ER records reviewed.  As noted in HPI.  Patient is mildly tachycardic, and a d-dimer has not yet been done, so we will do one today.  We will call the patient at [phone] if the d-dimer is positive, and she will need to go to the ER for a CTA-P.  If negative, will continue trying to treat for common causes of cough.  She will continue her Flonase for allergic  rhinitis, will refill her albuterol, she will do 2 puffs every 4-6 hours using a spacer as needed for coughing, wheezing, shortness of breath.  We will also try some Pepcid in case that this is silent acid reflux.  Suspect that the chest pain is musculoskeletal from the coughing as she has reproducible chest wall tenderness.  Sending home with IBU 600 mg take with 1 g of Tylenol together 3 or 4 times a day as needed for chest wall pain, , and also some Tessalon.  Work note for today.  Giving primary care referral list.  Did not repeat chest x-ray as she has had 2 negative chest x-rays since the beginning of this illness, been afebrile, and has clear lungs.  Previous 2 EKGs reviewed,  were sinus tachycardia, but otherwise normal.  An EKG was not repeated today.  D-dimer negative.  Patient will need to follow-up with a primary care physician or pulmonologist for evaluation of her chronic cough.  Discussed labs,  MDM, treatment plan, and plan for follow-up with patient. Discussed sn/sx that should prompt return to the ED. patient agrees with plan.   Meds ordered this encounter  Medications  . famotidine (PEPCID) 20 MG tablet    Sig: Take 1 tablet (20 mg total) by mouth 2 (two) times daily.    Dispense:  40 tablet    Refill:  0  . Spacer/Aero-Holding Chambers (AEROCHAMBER PLUS) inhaler    Sig: Use as instructed    Dispense:  1 each    Refill:  2  . albuterol (PROVENTIL HFA;VENTOLIN HFA) 108 (90 Base) MCG/ACT inhaler    Sig: Inhale 1-2 puffs into the lungs every 6 (six) hours as needed for wheezing or shortness of breath.    Dispense:  1 Inhaler    Refill:  0  . DISCONTD: ibuprofen (ADVIL,MOTRIN) 600 MG tablet    Sig: Take 1 tablet (600 mg total) by mouth every 6 (six) hours as needed.    Dispense:  30 tablet    Refill:  0  . ibuprofen (ADVIL,MOTRIN) 600 MG tablet    Sig: Take 1 tablet (600 mg total) by mouth every 6 (six) hours as needed.    Dispense:  30 tablet    Refill:  0  . benzonatate  (TESSALON) 200 MG capsule    Sig: Take 1 capsule (200 mg total) by mouth 3 (three) times daily as needed for cough.    Dispense:  30 capsule    Refill:  0    *This clinic note was created using Scientist, clinical (histocompatibility and immunogenetics). Therefore, there may be occasional mistakes despite careful proofreading.   ?   Domenick Gong, MD 11/16/18 2037

## 2018-11-16 NOTE — Discharge Instructions (Addendum)
I will call you if your d-dimer comes back positive.  You will need to go immediately to the emergency room to have further work-up done if it is positive.  You can also call here in several hours if you do not hear from me at your results.  In the meantime, try some Pepcid, continue 2 puffs from your albuterol inhaler using your spacer every 4-6 hours as needed for coughing, ibuprofen 600 mg with 1 g of Tylenol together 3 or 4 times a day as needed for chest wall pain.  Tessalon will help with the cough.  Continue Flonase.  Follow-up with a primary care provider of your choice, see list below.  Below is a list of primary care practices who are taking new patients for you to follow-up with. [facility] [address] [place] [address] [phone]  [facility] [phone] [address] [place] [address]  [facility]: [address] [place] [address]  [phone]  [facility]: [address] [place] [id number]   [phone]  [facility]: [address] [place] [address]  [phone]  [facility] : [address] [place] [address] [phone]  [facility]: [address] [place] [phone]  [facility]: [address] [place] [phone]  Dr. Oneal GroutMahima Pandey [address] [facility] [place] [address]  [phone]  Dr. Jackie PlumGeorge Osei-Bonsu, [facility]. [address] [place], [place] [address]  [phone]  Go to www.goodrx.com to look up your medications. This will give you a list of where you can find your prescriptions at the most affordable prices. Or ask the pharmacist what the cash  price is, or if they have any other discount programs available to help make your medication more affordable. This can be less expensive than what you would pay with insurance.

## 2018-11-16 NOTE — ED Triage Notes (Signed)
`  [facility] for [facility], rib pain and now small bumps on her face and around her mouth.  Patient states she has been to multiple doctors without relief or diagnosis.

## 2018-11-17 ENCOUNTER — Emergency Department (HOSPITAL_COMMUNITY): Payer: Medicaid Other

## 2018-11-17 ENCOUNTER — Other Ambulatory Visit: Payer: Self-pay

## 2018-11-17 ENCOUNTER — Emergency Department (HOSPITAL_COMMUNITY)
Admission: EM | Admit: 2018-11-17 | Discharge: 2018-11-17 | Disposition: A | Discharge status: Home / Self Care | Payer: Medicaid Other | Attending: Emergency Medicine | Admitting: Emergency Medicine

## 2018-11-17 ENCOUNTER — Encounter (HOSPITAL_COMMUNITY): Payer: Self-pay

## 2018-11-17 DIAGNOSIS — I10 Essential (primary) hypertension: Secondary | ICD-10-CM | POA: Insufficient documentation

## 2018-11-17 DIAGNOSIS — J209 Acute bronchitis, unspecified: Secondary | ICD-10-CM | POA: Diagnosis not present

## 2018-11-17 DIAGNOSIS — Z79899 Other long term (current) drug therapy: Secondary | ICD-10-CM | POA: Diagnosis not present

## 2018-11-17 DIAGNOSIS — R05 Cough: Secondary | ICD-10-CM | POA: Diagnosis present

## 2018-11-17 DIAGNOSIS — J40 Bronchitis, not specified as acute or chronic: Secondary | ICD-10-CM

## 2018-11-17 MED ORDER — IPRATROPIUM-ALBUTEROL 0.5-2.5 (3) MG/3ML IN SOLN
3.0000 mL | Freq: Once | RESPIRATORY_TRACT | Status: AC
  Administered 2018-11-17: 3 mL via RESPIRATORY_TRACT
  Filled 2018-11-17: qty 3

## 2018-11-17 MED ORDER — HYDROCOD POLST-CPM POLST ER 10-8 MG/5ML PO SUER
5.0000 mL | Freq: Once | ORAL | Status: AC
  Administered 2018-11-17: 5 mL via ORAL
  Filled 2018-11-17: qty 5

## 2018-11-17 MED ORDER — HYDROCODONE-CHLORPHENIRAMINE 5-4 MG/5ML PO SOLN: 5.0000 mL | Freq: Every evening | ORAL | 0 refills | Status: DC | PRN

## 2018-11-17 MED ORDER — ALBUTEROL SULFATE (2.5 MG/3ML) 0.083% IN NEBU: 2.5000 mg | INHALATION_SOLUTION | Freq: Four times a day (QID) | RESPIRATORY_TRACT | 12 refills | Status: DC | PRN

## 2018-11-17 NOTE — ED Provider Notes (Signed)
[facility] EMERGENCY DEPARTMENT Provider Note   CSN: [id number] Arrival date & time: 11/17/18  1548     History   Chief Complaint Chief Complaint  Patient presents with  . Cough  . Shortness of Breath    HPI Ebony Wilson is a 17 y.o. female who presents to ED for persistent cough, central chest and rib pain, wheezing since September 2019.  States that she has shortness of breath when she coughs as well.  She also reports several episodes of nonbloody, nonbilious posttussive emesis.  She has tried Protonix, steroids, Tessalon Perles, cough syrup, antihistamines but still has a persistent cough.  States that the cough is worse when she lays down, when she is at work around a [employer].  She also states that the cold weather has exacerbated her symptoms.  She denies any history of PE or DVT.  She does report a history of "bronchitis since I was a kid."  States that her brother is now starting to feel sick as well.  Denies any chest pain at rest, hemoptysis, leg swelling, fever, abdominal pain. Denies possibility of pregnancy.  HPI  Past Medical History:  Diagnosis Date  . Headache    at her period   . Hypertension    as a child (had to diet to get bp down, no longer an issue)  . PTSD (post-traumatic stress disorder) 07/18/2016    Patient Active Problem List   Diagnosis Date Noted  . Bacterial vaginosis 09/20/2018  . PTSD (post-traumatic stress disorder) 07/18/2016  . MDD (major depressive disorder), recurrent episode, severe (HCC) 07/17/2016    Past Surgical History:  Procedure Laterality Date  . CHOLECYSTECTOMY N/A 02/08/2018   Procedure: LAPAROSCOPIC CHOLECYSTECTOMY;  Surgeon: Axel Filler, MD;  Location: [facility] OR;  Service: General;  Laterality: N/A;  . TONSILECTOMY, ADENOIDECTOMY, BILATERAL MYRINGOTOMY AND TUBES    . TONSILLECTOMY     age 68  . WISDOM TOOTH EXTRACTION       OB History   No obstetric history on file.      Home Medications     Prior to Admission medications   Medication Sig Start Date End Date Taking? Authorizing Provider  acetaminophen (TYLENOL) 325 MG tablet Take 650 mg by mouth every 6 (six) hours as needed.    [provider]  albuterol (PROVENTIL) (2.5 MG/3ML) 0.083% nebulizer solution Take 3 mLs (2.5 mg total) by nebulization every 6 (six) hours as needed for wheezing or shortness of breath. 11/17/18   Kennia Vanvorst, PA-C  benzonatate (TESSALON) 200 MG capsule Take 1 capsule (200 mg total) by mouth 3 (three) times daily as needed for cough. 11/16/18   Domenick Gong, MD  Cetirizine HCl 10 MG CAPS Take 1 capsule (10 mg total) by mouth daily for 10 days. 10/27/18 11/06/18  Wieters, Hallie C, PA-C  famotidine (PEPCID) 20 MG tablet Take 1 tablet (20 mg total) by mouth 2 (two) times daily. 11/16/18   Domenick Gong, MD  fluticasone (FLONASE) 50 MCG/ACT nasal spray Place 1-2 sprays into both nostrils daily for 7 days. 10/27/18 11/03/18  Wieters, Hallie C, PA-C  HYDROcodone-Chlorpheniramine 5-4 MG/5ML SOLN Take 5 mLs by mouth at bedtime as needed. 11/17/18   Kasiyah Platter, PA-C  ibuprofen (ADVIL,MOTRIN) 600 MG tablet Take 1 tablet (600 mg total) by mouth every 6 (six) hours as needed. 11/16/18   Domenick Gong, MD  montelukast (SINGULAIR) 10 MG tablet Take 1 tablet (10 mg total) by mouth at bedtime. 11/09/18   Elisha Ponder,  PA-C  Spacer/Aero-Holding Chambers (AEROCHAMBER PLUS) inhaler Use as instructed 11/16/18   Domenick GongMortenson, Ashley, MD    Family History Family History  Problem Relation Age of Onset  . Other Mother        has a hole in her heart    Social History Social History   Tobacco Use  . Smoking status: Never Smoker  . Smokeless tobacco: Never Used  Substance Use Topics  . Alcohol use: No  . Drug use: No     Allergies   Watermelon [citrullus vulgaris]   Review of Systems Review of Systems  Constitutional: Negative for appetite change, chills and fever.  HENT: Negative for  ear pain, rhinorrhea, sneezing and sore throat.   Eyes: Negative for photophobia and visual disturbance.  Respiratory: Positive for cough, chest tightness, shortness of breath and wheezing.   Cardiovascular: Negative for chest pain and palpitations.  Gastrointestinal: Negative for abdominal pain, blood in stool, constipation, diarrhea, nausea and vomiting.  Genitourinary: Negative for dysuria, hematuria and urgency.  Musculoskeletal: Negative for myalgias.  Skin: Negative for rash.  Neurological: Negative for dizziness, weakness and light-headedness.     Physical Exam Updated Vital Signs BP (!) 147/91 (BP Location: Left Arm)   Pulse (!) 119   Temp 98.7 F (37.1 C) (Oral)   Resp 20   LMP 11/09/2018   SpO2 100%   Physical Exam Vitals signs and nursing note reviewed.  Constitutional:      General: She is not in acute distress.    Appearance: She is well-developed.     Comments: Hacking cough noted on my exam.  Speaking complete sentences otherwise.  HENT:     Head: Normocephalic and atraumatic.     Nose: Nose normal.  Eyes:     General: No scleral icterus.       Left eye: No discharge.     Conjunctiva/sclera: Conjunctivae normal.  Neck:     Musculoskeletal: Normal range of motion and neck supple.  Cardiovascular:     Rate and Rhythm: Regular rhythm. Tachycardia present.     Heart sounds: Normal heart sounds. No murmur. No friction rub. No gallop.   Pulmonary:     Effort: Pulmonary effort is normal. No respiratory distress.     Breath sounds: Normal breath sounds.  Chest:       Comments: Chest pain is reproducible to palpation in the central chest and bilateral lower ribs. Abdominal:     General: Bowel sounds are normal. There is no distension.     Palpations: Abdomen is soft.     Tenderness: There is no abdominal tenderness. There is no guarding.  Musculoskeletal: Normal range of motion.     Comments: No lower extremity edema, erythema or calf tenderness bilaterally.   Skin:    General: Skin is warm and dry.     Findings: No rash.  Neurological:     Mental Status: She is alert.     Motor: No abnormal muscle tone.     Coordination: Coordination normal.      ED Treatments / Results  Labs (all labs ordered are listed, but only abnormal results are displayed) Labs Reviewed - No data to display  EKG None  Radiology Dg Chest 2 View  Result Date: 11/17/2018 CLINICAL DATA:  Three-month history of cough and nausea. EXAM: CHEST - 2 VIEW COMPARISON:  11/09/2018, 10/29/2018, 03/28/2015. FINDINGS: Suboptimal inspiration accounts for crowded bronchovascular markings, especially in the bases, and accentuates the cardiac silhouette. Taking this into account, cardiomediastinal silhouette  unremarkable and unchanged. Lungs clear. Bronchovascular markings normal. Pulmonary vascularity normal. No visible pleural effusions. No pneumothorax. Visualized bony thorax intact. IMPRESSION: Suboptimal inspiration. No acute cardiopulmonary disease. Electronically Signed   By: Hulan Saas M.D.   On: 11/17/2018 16:52    Procedures Procedures (including critical care time)  Medications Ordered in ED Medications  chlorpheniramine-HYDROcodone (TUSSIONEX) 10-8 MG/5ML suspension 5 mL (5 mLs Oral Given 11/17/18 1622)  ipratropium-albuterol (DUONEB) 0.5-2.5 (3) MG/3ML nebulizer solution 3 mL (3 mLs Nebulization Given 11/17/18 1622)     Initial Impression / Assessment and Plan / ED Course  I have reviewed the triage vital signs and the nursing notes.  Pertinent labs & imaging results that were available during my care of the patient were reviewed by me and considered in my medical decision making (see chart for details).  Clinical Course as of Nov 18 1723  Thu Nov 17, 2018  1618 D-dimer collected yesterday at urgent care was negative.   [HK]  1659 Patient with significant improvement in her cough with medications given.   [HK]  1659 HR improved to 102.   [HK]     Clinical Course User Index [HK] Dietrich Pates, PA-C   17 year old female presents to ED for persistent cough, central chest and rib pain, wheezing.  Symptoms have been going on since September 2019.  Reports chest pain and shortness of breath when she coughs.  She also reports posttussive emesis several times during the course of the illness.  No improvement with Protonix, steroids, Tessalon Perles, antihistamines.  She has been using her inhaler with no improvement in her symptoms.  She was seen and evaluated about 3 times since symptoms began.  She last saw urgent care last night and had negative d-dimer done.  On exam she is overall well-appearing.  Mildly tachycardic, dry cough noted on exam.  She is afebrile.  Lungs are clear to auscultation bilaterally.  Chest x-ray shows poor inspiration but otherwise unremarkable.  Patient given Tussionex, nebulizer treatment with significant improvement in her symptoms.  Patient is requesting nebulizer machine at home which I feel is reasonable based on the duration of her illness and cough noted on my exam.  Will give a prescription for cough medicine and albuterol nebulizer medication as well.  Patient is agreeable to this plan.  I doubt PE as a cause of her symptoms and was ruled out with a negative d-dimer.  Her oxygen saturations have stayed at 99 200% on room air throughout her ED course.  Will advise her to return to ED for any severe worsening symptoms.  Patient is hemodynamically stable, in NAD, and able to ambulate in the ED. Evaluation does not show pathology that would require ongoing emergent intervention or inpatient treatment. I explained the diagnosis to the patient. Pain has been managed and has no complaints prior to discharge. Patient is comfortable with above plan and is stable for discharge at this time. All questions were answered prior to disposition. Strict return precautions for returning to the ED were discussed. Encouraged follow up with  PCP.    Portions of this note were generated with Scientist, clinical (histocompatibility and immunogenetics). Dictation errors may occur despite best attempts at proofreading.  Final Clinical Impressions(s) / ED Diagnoses   Final diagnoses:  Bronchitis    ED Discharge Orders         Ordered    DME Nebulizer machine     11/17/18 1701    albuterol (PROVENTIL) (2.5 MG/3ML) 0.083% nebulizer solution  Every 6  hours PRN     11/17/18 1701    HYDROcodone-Chlorpheniramine 5-4 MG/5ML SOLN  At bedtime PRN     11/17/18 1701           Dietrich Pates, PA-C 11/17/18 1724    Terrilee Files, MD 11/18/18 1052

## 2018-11-17 NOTE — ED Triage Notes (Signed)
Pt here for continued cough and shortness of breath that has gotten worse over the past few days with rib pain. Pt seen multiple times for the same with no relief of symptoms

## 2018-11-17 NOTE — Discharge Instructions (Signed)
Return to ED for worsening symptoms, coughing up blood, severe chest pain at rest, leg swelling.

## 2019-01-13 ENCOUNTER — Ambulatory Visit (HOSPITAL_COMMUNITY)
Admission: EM | Admit: 2019-01-13 | Discharge: 2019-01-13 | Disposition: A | Discharge status: Home / Self Care | Payer: Medicaid Other | Attending: Internal Medicine | Admitting: Internal Medicine

## 2019-01-13 ENCOUNTER — Encounter (HOSPITAL_COMMUNITY): Payer: Self-pay

## 2019-01-13 DIAGNOSIS — B349 Viral infection, unspecified: Secondary | ICD-10-CM | POA: Diagnosis not present

## 2019-01-13 MED ORDER — ALBUTEROL SULFATE (2.5 MG/3ML) 0.083% IN NEBU: 2.5000 mg | INHALATION_SOLUTION | Freq: Four times a day (QID) | RESPIRATORY_TRACT | 12 refills | Status: DC | PRN

## 2019-01-13 MED ORDER — SODIUM CHLORIDE 0.9 % IV BOLUS
1000.0000 mL | Freq: Once | INTRAVENOUS | Status: AC
  Administered 2019-01-13: 1000 mL via INTRAVENOUS

## 2019-01-13 MED ORDER — BENZONATATE 200 MG PO CAPS: 200.0000 mg | ORAL_CAPSULE | Freq: Three times a day (TID) | ORAL | 0 refills | Status: DC | PRN

## 2019-01-13 MED ORDER — ACETAMINOPHEN 325 MG PO TABS: 650.0000 mg | ORAL_TABLET | Freq: Four times a day (QID) | ORAL | Status: DC | PRN

## 2019-01-13 NOTE — ED Triage Notes (Signed)
Pt presents with lower pelvic cramping from left to right not associated with her period or an injury.  Pt is also experiencing flu like symptoms; body aches, nausea, chills, and headache X 2 days.

## 2019-01-13 NOTE — ED Provider Notes (Signed)
[facility]    CSN: [id number] Arrival date & time: 01/13/19  1445     History   Chief Complaint Chief Complaint  Patient presents with  . Influenza  . Pelvic Cramping    HPI Ebony Wilson is a 18 y.o. female comes to the urgent care department on account of generalized body aches, runny nose, sore throat and chills of 2 days duration.  Symptoms started insidiously and is getting progressively worse.  She has a cough which is nonproductive and admits to having some wheezing.  No relieving factors.  Patient was previously using bronchodilator treatments for bronchitis but has since run out of her nebulizer solutions.  She admits to having some dizziness, near syncopal episode but denies any syncope.  No chest pain or chest pressure.  No nausea or vomiting.  Patient denies any fever.  HPI  Past Medical History:  Diagnosis Date  . Headache    at her period   . Hypertension    as a child (had to diet to get bp down, no longer an issue)  . PTSD (post-traumatic stress disorder) 07/18/2016    Patient Active Problem List   Diagnosis Date Noted  . Bacterial vaginosis 09/20/2018  . PTSD (post-traumatic stress disorder) 07/18/2016  . MDD (major depressive disorder), recurrent episode, severe (HCC) 07/17/2016    Past Surgical History:  Procedure Laterality Date  . CHOLECYSTECTOMY N/A 02/08/2018   Procedure: LAPAROSCOPIC CHOLECYSTECTOMY;  Surgeon: Axel Filler, MD;  Location: [facility] OR;  Service: General;  Laterality: N/A;  . TONSILECTOMY, ADENOIDECTOMY, BILATERAL MYRINGOTOMY AND TUBES    . TONSILLECTOMY     age 46  . WISDOM TOOTH EXTRACTION      OB History   No obstetric history on file.      Home Medications    Prior to Admission medications   Medication Sig Start Date End Date Taking? Authorizing Provider  acetaminophen (TYLENOL) 325 MG tablet Take 2 tablets (650 mg total) by mouth every 6 (six) hours as needed. 01/13/19   Merrilee Jansky, MD  albuterol  (PROVENTIL) (2.5 MG/3ML) 0.083% nebulizer solution Take 3 mLs (2.5 mg total) by nebulization every 6 (six) hours as needed for wheezing or shortness of breath. 01/13/19   Seyed Heffley, Britta Mccreedy, MD  benzonatate (TESSALON) 200 MG capsule Take 1 capsule (200 mg total) by mouth 3 (three) times daily as needed for cough. 01/13/19   Lianny Molter, Britta Mccreedy, MD  Cetirizine HCl 10 MG CAPS Take 1 capsule (10 mg total) by mouth daily for 10 days. 10/27/18 11/06/18  Wieters, Hallie C, PA-C  famotidine (PEPCID) 20 MG tablet Take 1 tablet (20 mg total) by mouth 2 (two) times daily. 11/16/18   Domenick Gong, MD  fluticasone (FLONASE) 50 MCG/ACT nasal spray Place 1-2 sprays into both nostrils daily for 7 days. 10/27/18 11/03/18  Wieters, Hallie C, PA-C  HYDROcodone-Chlorpheniramine 5-4 MG/5ML SOLN Take 5 mLs by mouth at bedtime as needed. 11/17/18   Khatri, Hina, PA-C  ibuprofen (ADVIL,MOTRIN) 600 MG tablet Take 1 tablet (600 mg total) by mouth every 6 (six) hours as needed. 11/16/18   Domenick Gong, MD  montelukast (SINGULAIR) 10 MG tablet Take 1 tablet (10 mg total) by mouth at bedtime. 11/09/18   Aviva Kluver B, PA-C  Spacer/Aero-Holding Chambers (AEROCHAMBER PLUS) inhaler Use as instructed 11/16/18   Domenick Gong, MD    Family History Family History  Problem Relation Age of Onset  . Other Mother        has a  hole in her heart    Social History Social History   Tobacco Use  . Smoking status: Never Smoker  . Smokeless tobacco: Never Used  Substance Use Topics  . Alcohol use: No  . Drug use: No     Allergies   Watermelon [citrullus vulgaris]   Review of Systems Review of Systems  Constitutional: Positive for activity change, appetite change, chills and fatigue. Negative for fever.  HENT: Positive for ear pain, rhinorrhea, sinus pressure, sore throat and tinnitus. Negative for ear discharge, postnasal drip and voice change.   Eyes: Positive for discharge and itching.  Respiratory: Positive for  chest tightness, shortness of breath and wheezing.   Cardiovascular: Positive for chest pain.  Gastrointestinal: Negative for abdominal distention, abdominal pain and constipation.  Genitourinary: Negative for dysuria, flank pain and frequency.  Musculoskeletal: Positive for arthralgias, back pain and joint swelling. Negative for neck stiffness.  Skin: Negative for pallor and rash.  Allergic/Immunologic: Negative for environmental allergies and food allergies.  Neurological: Negative for dizziness, numbness and headaches.  Hematological: Negative.  Negative for adenopathy.     Physical Exam Triage Vital Signs ED Triage Vitals  Enc Vitals Group     BP 01/13/19 1532 139/89     Pulse Rate 01/13/19 1532 (!) 110     Resp 01/13/19 1532 18     Temp 01/13/19 1532 98.1 F (36.7 C)     Temp src --      SpO2 01/13/19 1532 100 %     Weight --      Height --      Head Circumference --      Peak Flow --      Pain Score 01/13/19 1536 9     Pain Loc --      Pain Edu? --      Excl. in GC? --    No data found.  Updated Vital Signs BP 139/89 (BP Location: Right Arm)   Pulse (!) 110   Temp 98.1 F (36.7 C)   Resp 18   LMP 01/03/2019   SpO2 100%   Visual Acuity Right Eye Distance:   Left Eye Distance:   Bilateral Distance:    Right Eye Near:   Left Eye Near:    Bilateral Near:     Physical Exam Constitutional:      Appearance: Normal appearance. She is not ill-appearing.  HENT:     Right Ear: Tympanic membrane normal.     Left Ear: Tympanic membrane normal.     Nose: Congestion and rhinorrhea present.     Mouth/Throat:     Mouth: Mucous membranes are moist.     Pharynx: No oropharyngeal exudate or posterior oropharyngeal erythema.  Eyes:     Extraocular Movements: Extraocular movements intact.     Pupils: Pupils are equal, round, and reactive to light.  Neck:     Musculoskeletal: Normal range of motion.  Cardiovascular:     Rate and Rhythm: Normal rate and regular  rhythm.     Pulses: Normal pulses.     Heart sounds: Normal heart sounds.  Pulmonary:     Effort: Pulmonary effort is normal.     Breath sounds: Normal breath sounds.  Abdominal:     General: Abdomen is flat. There is no distension.     Palpations: Abdomen is soft. There is no mass.  Musculoskeletal:        General: No tenderness.  Lymphadenopathy:     Cervical: No cervical adenopathy.  Skin:    Capillary Refill: Capillary refill takes less than 2 seconds.  Neurological:     Mental Status: She is alert.      UC Treatments / Results  Labs (all labs ordered are listed, but only abnormal results are displayed) Labs Reviewed - No data to display  EKG None  Radiology No results found.  Procedures Procedures (including critical care time)  Medications Ordered in UC Medications  sodium chloride 0.9 % bolus 1,000 mL (has no administration in time range)    Initial Impression / Assessment and Plan / UC Course  I have reviewed the triage vital signs and the nursing notes.  Pertinent labs & imaging results that were available during my care of the patient were reviewed by me and considered in my medical decision making (see chart for details).     1.  Acute viral nasopharyngitis : Tylenol/NSAIDs for fever/body aches Encourage oral fluid intake  2.  Orthostatic dizziness: Normal saline 1 L bolus Orthostatic blood pressures  3.  Acute bronchospasm: Refill nebulizer solutions Nebulizer every 6 hours as needed. Patient is currently not wheezing.  Final Clinical Impressions(s) / UC Diagnoses   Final diagnoses:  Viral illness   Discharge Instructions   None    ED Prescriptions    Medication Sig Dispense Auth. Provider   benzonatate (TESSALON) 200 MG capsule Take 1 capsule (200 mg total) by mouth 3 (three) times daily as needed for cough. 30 capsule Yuval Rubens, Britta MccreedyPhilip O, MD   acetaminophen (TYLENOL) 325 MG tablet Take 2 tablets (650 mg total) by mouth every 6 (six)  hours as needed.  Merrilee JanskyLamptey, Kaelob Persky O, MD   albuterol (PROVENTIL) (2.5 MG/3ML) 0.083% nebulizer solution Take 3 mLs (2.5 mg total) by nebulization every 6 (six) hours as needed for wheezing or shortness of breath. 75 mL Rollo Farquhar, Britta MccreedyPhilip O, MD     Controlled Substance Prescriptions  Controlled Substance Registry consulted? No   Merrilee JanskyLamptey, Cliff Damiani O, MD 01/13/19 [phone]

## 2019-01-16 ENCOUNTER — Other Ambulatory Visit: Payer: Self-pay

## 2019-01-16 ENCOUNTER — Emergency Department (HOSPITAL_COMMUNITY)
Admission: EM | Admit: 2019-01-16 | Discharge: 2019-01-16 | Disposition: A | Discharge status: Home / Self Care | Payer: Medicaid Other | Attending: Emergency Medicine | Admitting: Emergency Medicine

## 2019-01-16 DIAGNOSIS — R1030 Lower abdominal pain, unspecified: Secondary | ICD-10-CM | POA: Diagnosis not present

## 2019-01-16 DIAGNOSIS — I10 Essential (primary) hypertension: Secondary | ICD-10-CM | POA: Diagnosis not present

## 2019-01-16 DIAGNOSIS — R002 Palpitations: Secondary | ICD-10-CM

## 2019-01-16 DIAGNOSIS — H6991 Unspecified Eustachian tube disorder, right ear: Secondary | ICD-10-CM

## 2019-01-16 DIAGNOSIS — Z79899 Other long term (current) drug therapy: Secondary | ICD-10-CM | POA: Diagnosis not present

## 2019-01-16 DIAGNOSIS — Z3202 Encounter for pregnancy test, result negative: Secondary | ICD-10-CM | POA: Diagnosis not present

## 2019-01-16 DIAGNOSIS — H9201 Otalgia, right ear: Secondary | ICD-10-CM | POA: Insufficient documentation

## 2019-01-16 LAB — URINALYSIS, ROUTINE W REFLEX MICROSCOPIC
Bilirubin Urine: NEGATIVE
GLUCOSE, UA: NEGATIVE mg/dL
HGB URINE DIPSTICK: NEGATIVE
KETONES UR: NEGATIVE mg/dL
Leukocytes, UA: NEGATIVE
Nitrite: NEGATIVE
PH: 7 (ref 5.0–8.0)
Protein, ur: NEGATIVE mg/dL
Specific Gravity, Urine: 1.019 (ref 1.005–1.030)

## 2019-01-16 LAB — PREGNANCY, URINE: Preg Test, Ur: NEGATIVE

## 2019-01-16 MED ORDER — LORATADINE 10 MG PO TABS: 10.0000 mg | ORAL_TABLET | Freq: Every day | ORAL | 0 refills | Status: DC

## 2019-01-16 NOTE — ED Provider Notes (Signed)
[facility] EMERGENCY DEPARTMENT Provider Note   CSN: [id number] Arrival date & time: 01/16/19  1509     History   [facility] No chief complaint on file.   HPI Ebony Wilson is a 18 y.o. female.  HPI Patient has multiple complaints.  She reports that she is having pain in her right ear.  That is sharp and aching.  She reports that she is not having any problems hearing.  She has not had fevers or chills.  No sore throat. Patient reports she is also noticing that her heart is racing sometimes.  She reports that she will just feel a fluttering in her chest.  This is really something she only started noticing since she was seen in urgent care and she was told that her heart rate was elevated.  She reports she does not feel nervous or uncomfortable.  She does not feel short of breath or have chest pain. Patient reports she also feels like she has a little bit of suprapubic discomfort.  She reports that she wonders if she is pregnant.  She would like a pregnancy test and urine test. Patient reports she is sexually active but she is not see OB/GYN.  Past Medical History:  Diagnosis Date  . Headache    at her period   . Hypertension    as a child (had to diet to get bp down, no longer an issue)  . PTSD (post-traumatic stress disorder) 07/18/2016    Patient Active Problem List   Diagnosis Date Noted  . Bacterial vaginosis 09/20/2018  . PTSD (post-traumatic stress disorder) 07/18/2016  . MDD (major depressive disorder), recurrent episode, severe (HCC) 07/17/2016    Past Surgical History:  Procedure Laterality Date  . CHOLECYSTECTOMY N/A 02/08/2018   Procedure: LAPAROSCOPIC CHOLECYSTECTOMY;  Surgeon: Axel Filleramirez, Armando, MD;  [facility];  Service: General;  Laterality: N/A;  . TONSILECTOMY, ADENOIDECTOMY, BILATERAL MYRINGOTOMY AND TUBES    . TONSILLECTOMY     age [age]  . WISDOM TOOTH EXTRACTION       OB History   No obstetric history on file.       Home Medications    Prior to Admission medications   Medication Sig Start Date End Date Taking? Authorizing Provider  acetaminophen (TYLENOL) 325 MG tablet Take 2 tablets (650 mg total) by mouth every 6 (six) hours as needed. 01/13/19   Merrilee JanskyLamptey, Philip O, MD  albuterol (PROVENTIL) (2.5 MG/3ML) 0.083% nebulizer solution Take 3 mLs (2.5 mg total) by nebulization every 6 (six) hours as needed for wheezing or shortness of breath. 01/13/19   Lamptey, Britta MccreedyPhilip O, MD  benzonatate (TESSALON) 200 MG capsule Take 1 capsule (200 mg total) by mouth 3 (three) times daily as needed for cough. 01/13/19   Lamptey, Britta MccreedyPhilip O, MD  Cetirizine HCl 10 MG CAPS Take 1 capsule (10 mg total) by mouth daily for 10 days. 10/27/18 11/06/18  Wieters, Hallie C, PA-C  famotidine (PEPCID) 20 MG tablet Take 1 tablet (20 mg total) by mouth 2 (two) times daily. 11/16/18   Domenick GongMortenson, Ashley, MD  fluticasone (FLONASE) 50 MCG/ACT nasal spray Place 1-2 sprays into both nostrils daily for 7 days. 10/27/18 11/03/18  Wieters, Hallie C, PA-C  HYDROcodone-Chlorpheniramine 5-4 MG/5ML SOLN Take 5 mLs by mouth at bedtime as needed. 11/17/18   Khatri, Hina, PA-C  ibuprofen (ADVIL,MOTRIN) 600 MG tablet Take 1 tablet (600 mg total) by mouth every 6 (six) hours as needed. 11/16/18   Domenick GongMortenson, Ashley, MD  montelukast (  SINGULAIR) 10 MG tablet Take 1 tablet (10 mg total) by mouth at bedtime. 11/09/18   Aviva KluverMurray, Alyssa B, PA-C  Spacer/Aero-Holding Chambers (AEROCHAMBER PLUS) inhaler Use as instructed 11/16/18   Domenick GongMortenson, Ashley, MD    Family History Family History  Problem Relation Age of Onset  . Other Mother        has a hole in her heart    Social History Social History   Tobacco Use  . Smoking status: Never Smoker  . Smokeless tobacco: Never Used  Substance Use Topics  . Alcohol use: No  . Drug use: No     Allergies   Watermelon [citrullus vulgaris]   Review of Systems Review of Systems 10 Systems reviewed and are negative for  acute change except as noted in the HPI.  Physical Exam Updated Vital Signs BP 124/84 (BP Location: Right Arm)   Pulse 95   Temp 98.7 F (37.1 C) (Oral)   Resp 16   LMP 12/25/2018 (Approximate)   SpO2 94%   Physical Exam Constitutional:      Appearance: She is well-developed.     Comments: Patient is clinically well in appearance.  She is in no distress.  She is using her phone, up and ambulatory all actions normal and comfortable in appearance.  HENT:     Head: Normocephalic and atraumatic.     Ears:     Comments: Right TM has tympanosclerosis but no erythema or bulging.  Left TM normal.  Mucous membranes pink and moist.  Dentition in good condition.  Posterior airway widely patent.  No facial swelling. Eyes:     Pupils: Pupils are equal, round, and reactive to light.  Neck:     Musculoskeletal: Neck supple.  Cardiovascular:     Rate and Rhythm: Normal rate and regular rhythm.     Heart sounds: Normal heart sounds.  Pulmonary:     Effort: Pulmonary effort is normal.     Breath sounds: Normal breath sounds.  Abdominal:     General: Bowel sounds are normal. There is no distension.     Palpations: Abdomen is soft.     Tenderness: There is no abdominal tenderness.  Musculoskeletal: Normal range of motion.  Skin:    General: Skin is warm and dry.  Neurological:     Mental Status: She is alert and oriented to person, place, and time.     GCS: GCS eye subscore is 4. GCS verbal subscore is 5. GCS motor subscore is 6.     Coordination: Coordination normal.  Psychiatric:        Mood and Affect: Mood normal.      ED Treatments / Results  Labs (all labs ordered are listed, but only abnormal results are displayed) Labs Reviewed  URINALYSIS, ROUTINE W REFLEX MICROSCOPIC  PREGNANCY, URINE    EKG None  Radiology No results found.  Procedures Procedures (including critical care time)  Medications Ordered in ED Medications - No data to display   Initial Impression /  Assessment and Plan / ED Course  I have reviewed the triage vital signs and the nursing notes.  Pertinent labs & imaging results that were available during my care of the patient were reviewed by me and considered in my medical decision making (see chart for details).    Patient clinically well.  At this time heart is regular and normal sinus rhythm.  No tachycardia or abnormal exam.  Patient counseled to follow-up with her family doctor and if symptoms are  persisting she may need Holter monitoring.  At this time she appears very low risk for emergent cardiac etiology.  Patient is stable for follow-up regarding palpitations which she is only noticed since being at urgent care made aware that her heart rate had been elevated there. Right ear has tympanosclerosis but otherwise normal in appearance.  Patient also reports some clicking sounds sometimes when she swallows and a pressure feeling in the ear.  Likely eustachian tube dysfunction.  Recommend daily Claritin for 2 weeks and follow-up with ENT. Patient wanted urinalysis checked for pregnancy test.  Abdomen is soft and nontender.  Do not feel patient needs other additional diagnostic evaluation at this time.  Will check urinalysis.  Still pending at this time.  For some reason this has been delayed.  Patient will be stable for discharge with return.  Final Clinical Impressions(s) / ED Diagnoses   Final diagnoses:  Otalgia of right ear  Eustachian tube disorder, right  Palpitation  Lower abdominal pain    ED Discharge Orders    None       Arby Barrette, MD 01/16/19 [phone]

## 2019-01-16 NOTE — ED Notes (Signed)
Pt provided with ice water so she can provide urine sample.

## 2019-01-16 NOTE — Discharge Instructions (Signed)
1.  You must get set up with a family doctor to further evaluate and monitor your symptoms.  You have several different symptoms that require different types of evaluation.  All appear stable at this time. 2.  Start taking Claritin once daily for the next 2 weeks to see if this alleviates your ear pain. 3.  Read instruction for palpitations and avoid all things that have caffeine and chocolate.  You need to avoid stimulant type medications.  Stay hydrated. 4.  Use the referral number in your discharge instructions to find a doctor for follow-up. 5.  All sexually active females should have regular evaluation with their gynecologist.  Use the referral number to find a gynecologist or call [facility] outpatient clinic.

## 2019-01-16 NOTE — ED Triage Notes (Signed)
Pt c/o L ear pain and heart racing onset x 3 days and sore throat, pt hr 97 in triage, afebrile, A&O x4

## 2019-02-01 ENCOUNTER — Ambulatory Visit (INDEPENDENT_AMBULATORY_CARE_PROVIDER_SITE_OTHER): Payer: Medicaid Other

## 2019-02-01 DIAGNOSIS — Z3202 Encounter for pregnancy test, result negative: Secondary | ICD-10-CM | POA: Diagnosis present

## 2019-02-01 LAB — POCT PREGNANCY, URINE: Preg Test, Ur: NEGATIVE

## 2019-02-01 NOTE — Progress Notes (Signed)
I have reviewed the chart and agree with nursing staff's documentation of this patient's encounter.  Vonzella Nipple, PA-C 02/01/2019 2:52 PM

## 2019-02-01 NOTE — Progress Notes (Signed)
Pt here today for pregnancy test.  Resulted negative.  Pt reports that her LMP is 01/01/19.  I advised pt that her period still may come within the week and if it does not to take a pregnancy test at that time.  Pt verbalized understanding.

## 2019-03-02 ENCOUNTER — Telehealth: Payer: Self-pay | Admitting: Obstetrics and Gynecology

## 2019-03-02 NOTE — Telephone Encounter (Signed)
The patient called and stated she took a pregnancy test in Feb and stated it was positive however her cycle started. She then stated she would like to have a pregnancy test complete in our office. Informed the office is not completing pregnancy test at this time. If she would like complete one more test and it is positive we will make an appointment.

## 2019-03-10 ENCOUNTER — Ambulatory Visit (INDEPENDENT_AMBULATORY_CARE_PROVIDER_SITE_OTHER): Payer: Medicaid Other

## 2019-03-10 ENCOUNTER — Encounter (HOSPITAL_COMMUNITY): Payer: Self-pay | Admitting: Emergency Medicine

## 2019-03-10 ENCOUNTER — Other Ambulatory Visit: Payer: Self-pay

## 2019-03-10 ENCOUNTER — Ambulatory Visit (HOSPITAL_COMMUNITY)
Admission: EM | Admit: 2019-03-10 | Discharge: 2019-03-10 | Disposition: A | Discharge status: Home / Self Care | Payer: Medicaid Other | Attending: Family Medicine | Admitting: Family Medicine

## 2019-03-10 DIAGNOSIS — W19XXXA Unspecified fall, initial encounter: Secondary | ICD-10-CM | POA: Diagnosis not present

## 2019-03-10 DIAGNOSIS — S4992XA Unspecified injury of left shoulder and upper arm, initial encounter: Secondary | ICD-10-CM | POA: Diagnosis not present

## 2019-03-10 DIAGNOSIS — S6992XA Unspecified injury of left wrist, hand and finger(s), initial encounter: Secondary | ICD-10-CM

## 2019-03-10 DIAGNOSIS — Z3202 Encounter for pregnancy test, result negative: Secondary | ICD-10-CM | POA: Diagnosis not present

## 2019-03-10 LAB — POCT PREGNANCY, URINE: Preg Test, Ur: NEGATIVE

## 2019-03-10 MED ORDER — IBUPROFEN 600 MG PO TABS: 600.0000 mg | ORAL_TABLET | Freq: Four times a day (QID) | ORAL | 0 refills | Status: DC | PRN

## 2019-03-10 NOTE — ED Provider Notes (Signed)
[facility]    CSN: [id number] Arrival date & time: 03/10/19  1451     History   Chief Complaint Chief Complaint  Patient presents with  . Arm Injury    HPI Ebony Wilson is a 18 y.o. female.   HPI   Patient states she fell yesterday and landed on her left outstretched hand.  She has pain in her left wrist.  Is uncertain regarding her last menstrual period.  Pregnancy testing will be done.  Past Medical History:  Diagnosis Date  . Headache    at her period   . Hypertension    as a child (had to diet to get bp down, no longer an issue)  . PTSD (post-traumatic stress disorder) 07/18/2016    Patient Active Problem List   Diagnosis Date Noted  . Bacterial vaginosis 09/20/2018  . PTSD (post-traumatic stress disorder) 07/18/2016  . MDD (major depressive disorder), recurrent episode, severe (HCC) 07/17/2016    Past Surgical History:  Procedure Laterality Date  . CHOLECYSTECTOMY N/A 02/08/2018   Procedure: LAPAROSCOPIC CHOLECYSTECTOMY;  Surgeon: Axel Filleramirez, Armando, MD;  [facility];  Service: General;  Laterality: N/A;  . TONSILECTOMY, ADENOIDECTOMY, BILATERAL MYRINGOTOMY AND TUBES    . TONSILLECTOMY     age 18  . WISDOM TOOTH EXTRACTION      OB History   No obstetric history on file.      Home Medications    Prior to Admission medications   Medication Sig Start Date End Date Taking? Authorizing Provider  acetaminophen (TYLENOL) 325 MG tablet Take 2 tablets (650 mg total) by mouth every 6 (six) hours as needed. 01/13/19   Merrilee JanskyLamptey, Philip O, MD  albuterol (PROVENTIL) (2.5 MG/3ML) 0.083% nebulizer solution Take 3 mLs (2.5 mg total) by nebulization every 6 (six) hours as needed for wheezing or shortness of breath. 01/13/19   Lamptey, Britta MccreedyPhilip O, MD  benzonatate (TESSALON) 200 MG capsule Take 1 capsule (200 mg total) by mouth 3 (three) times daily as needed for cough. 01/13/19   Lamptey, Britta MccreedyPhilip O, MD  Cetirizine HCl 10 MG CAPS Take 1 capsule (10 mg total) by mouth  daily for 10 days. 10/27/18 11/06/18  Wieters, Hallie C, PA-C  famotidine (PEPCID) 20 MG tablet Take 1 tablet (20 mg total) by mouth 2 (two) times daily. 11/16/18   Domenick GongMortenson, Ashley, MD  fluticasone (FLONASE) 50 MCG/ACT nasal spray Place 1-2 sprays into both nostrils daily for 7 days. 10/27/18 11/03/18  Wieters, Hallie C, PA-C  HYDROcodone-Chlorpheniramine 5-4 MG/5ML SOLN Take 5 mLs by mouth at bedtime as needed. 11/17/18   Khatri, Hina, PA-C  ibuprofen (ADVIL,MOTRIN) 600 MG tablet Take 1 tablet (600 mg total) by mouth every 6 (six) hours as needed. 03/10/19   Eustace MooreNelson, Teal Bontrager Sue, MD  loratadine (CLARITIN) 10 MG tablet Take 1 tablet (10 mg total) by mouth daily. One po daily x 5 days 01/16/19   Arby BarrettePfeiffer, Marcy, MD  montelukast (SINGULAIR) 10 MG tablet Take 1 tablet (10 mg total) by mouth at bedtime. 11/09/18   Aviva KluverMurray, Alyssa B, PA-C  Spacer/Aero-Holding Chambers (AEROCHAMBER PLUS) inhaler Use as instructed 11/16/18   Domenick GongMortenson, Ashley, MD    Family History Family History  Problem Relation Age of Onset  . Other Mother        has a hole in her heart    Social History Social History   Tobacco Use  . Smoking status: Never Smoker  . Smokeless tobacco: Never Used  Substance Use Topics  . Alcohol use: No  .  Drug use: No     Allergies   Watermelon [citrullus vulgaris]   Review of Systems Review of Systems  Constitutional: Negative for chills and fever.  HENT: Negative for ear pain and sore throat.   Eyes: Negative for pain and visual disturbance.  Respiratory: Negative for cough and shortness of breath.   Cardiovascular: Negative for chest pain and palpitations.  Gastrointestinal: Negative for abdominal pain and vomiting.  Genitourinary: Negative for dysuria and hematuria.  Musculoskeletal: Positive for arthralgias. Negative for back pain.  Skin: Negative for color change and rash.  Neurological: Negative for seizures and syncope.  All other systems reviewed and are negative.     Physical Exam Triage Vital Signs ED Triage Vitals  Enc Vitals Group     BP 03/10/19 1526 108/67     Pulse Rate 03/10/19 1523 (!) 105     Resp 03/10/19 1523 16     Temp 03/10/19 1523 98.1 F (36.7 C)     Temp Source 03/10/19 1523 Oral     SpO2 03/10/19 1523 98 %     Weight --      Height --      Head Circumference --      Peak Flow --      Pain Score 03/10/19 1521 8     Pain Loc --      Pain Edu? --      Excl. in GC? --    No data found.  Updated Vital Signs BP 108/67   Pulse (!) 105   Temp 98.1 F (36.7 C) (Oral)   Resp 16   LMP 12/21/2018   SpO2 98%   VPhysical Exam Constitutional:      General: She is not in acute distress.    Appearance: She is well-developed. She is obese.  HENT:     Head: Normocephalic and atraumatic.  Eyes:     Conjunctiva/sclera: Conjunctivae normal.     Pupils: Pupils are equal, round, and reactive to light.  Neck:     Musculoskeletal: Normal range of motion.  Cardiovascular:     Rate and Rhythm: Normal rate and regular rhythm.     Heart sounds: Normal heart sounds.  Pulmonary:     Effort: Pulmonary effort is normal. No respiratory distress.     Breath sounds: Normal breath sounds.  Abdominal:     General: There is no distension.     Palpations: Abdomen is soft.  Musculoskeletal: Normal range of motion.       Hands:  Skin:    General: Skin is warm and dry.  Neurological:     Mental Status: She is alert.      UC Treatments / Results  Labs (all labs ordered are listed, but only abnormal results are displayed) Labs Reviewed  POC URINE PREG, ED  POCT PREGNANCY, URINE    EKG None  Radiology Dg Wrist Complete Left  Result Date: 03/10/2019 CLINICAL DATA:  Fall, left wrist injury fifth metacarpal pain EXAM: LEFT WRIST - COMPLETE 3+ VIEW COMPARISON:  None. FINDINGS: There is no evidence of fracture or dislocation. There is no evidence of arthropathy or other focal bone abnormality. Soft tissues are unremarkable. IMPRESSION:  Negative. Electronically Signed   By: Judie Petit.  Shick M.D.   On: 03/10/2019 15:56    Procedures Procedures (including critical care time)  Medications Ordered in UC Medications - No data to display  Initial Impression / Assessment and Plan / UC Course  I have reviewed the triage vital signs and  the nursing notes.  Pertinent labs & imaging results that were available during my care of the patient were reviewed by me and considered in my medical decision making (see chart for details).     Final Clinical Impressions(s) / UC Diagnoses   Final diagnoses:  Injury of left upper extremity, initial encounter     Discharge Instructions     Use ice to reduce swelling and pain Take ibuprofen 3 times a day with food Do not use your hand while is painful May elevate hand to reduce swelling Expect improvement over next few days    ED Prescriptions    Medication Sig Dispense Auth. Provider   ibuprofen (ADVIL,MOTRIN) 600 MG tablet Take 1 tablet (600 mg total) by mouth every 6 (six) hours as needed. 30 tablet Eustace Moore, MD     Controlled Substance Prescriptions [place] Controlled Substance Registry consulted? Not Applicable   Eustace Moore, MD 03/10/19 2020

## 2019-03-10 NOTE — Discharge Instructions (Signed)
Use ice to reduce swelling and pain Take ibuprofen 3 times a day with food Do not use your hand while is painful May elevate hand to reduce swelling Expect improvement over next few days

## 2019-03-10 NOTE — ED Triage Notes (Signed)
PT fell yesterday and injured right arm

## 2019-05-22 ENCOUNTER — Ambulatory Visit (INDEPENDENT_AMBULATORY_CARE_PROVIDER_SITE_OTHER): Payer: Medicaid Other

## 2019-05-22 ENCOUNTER — Other Ambulatory Visit: Payer: Self-pay

## 2019-05-22 ENCOUNTER — Ambulatory Visit (HOSPITAL_COMMUNITY)
Admission: EM | Admit: 2019-05-22 | Discharge: 2019-05-22 | Disposition: A | Discharge status: Home / Self Care | Payer: Medicaid Other | Attending: Physician Assistant | Admitting: Physician Assistant

## 2019-05-22 ENCOUNTER — Encounter (HOSPITAL_COMMUNITY): Payer: Self-pay | Admitting: Emergency Medicine

## 2019-05-22 DIAGNOSIS — S99921A Unspecified injury of right foot, initial encounter: Secondary | ICD-10-CM

## 2019-05-22 DIAGNOSIS — S93601A Unspecified sprain of right foot, initial encounter: Secondary | ICD-10-CM

## 2019-05-22 DIAGNOSIS — S8991XA Unspecified injury of right lower leg, initial encounter: Secondary | ICD-10-CM | POA: Diagnosis not present

## 2019-05-22 DIAGNOSIS — X501XXA Overexertion from prolonged static or awkward postures, initial encounter: Secondary | ICD-10-CM | POA: Diagnosis not present

## 2019-05-22 DIAGNOSIS — M25561 Pain in right knee: Secondary | ICD-10-CM | POA: Diagnosis not present

## 2019-05-22 NOTE — ED Triage Notes (Signed)
Pt here for right knee and foot pain

## 2019-05-24 NOTE — ED Provider Notes (Signed)
[place]    CSN: [id number] Arrival date & time: 05/22/19  1447      History   Chief Complaint Chief Complaint  Patient presents with  . Knee Pain  . Foot Pain    HPI Ebony Wilson is a 18 y.o. female.   The history is provided by the patient. No language interpreter was used.  Knee Pain Location:  Knee Injury: no   Pain details:    Quality:  Aching   Onset quality:  Gradual   Timing:  Constant   Progression:  Worsening Chronicity:  New Dislocation: no   Relieved by:  Nothing Worsened by:  Nothing Foot Pain  Pt complains of pain to knee and foot.  Pt reports twistinginjury  Past Medical History:  Diagnosis Date  . Headache    at her period   . Hypertension    as a child (had to diet to get bp down, no longer an issue)  . PTSD (post-traumatic stress disorder) 07/18/2016    Patient Active Problem List   Diagnosis Date Noted  . Bacterial vaginosis 09/20/2018  . PTSD (post-traumatic stress disorder) 07/18/2016  . MDD (major depressive disorder), recurrent episode, severe ([place]) 07/17/2016    Past Surgical History:  Procedure Laterality Date  . CHOLECYSTECTOMY N/A 02/08/2018   Procedure: LAPAROSCOPIC CHOLECYSTECTOMY;  Surgeon: Ralene Ok, MD;  Location: [place];  Service: General;  Laterality: N/A;  . TONSILECTOMY, ADENOIDECTOMY, BILATERAL MYRINGOTOMY AND TUBES    . TONSILLECTOMY     age 82  . WISDOM TOOTH EXTRACTION      OB History   No obstetric history on file.      Home Medications    Prior to Admission medications   Medication Sig Start Date End Date Taking? Authorizing Provider  acetaminophen (TYLENOL) 325 MG tablet Take 2 tablets (650 mg total) by mouth every 6 (six) hours as needed. 01/13/19   Chase Picket, MD  albuterol (PROVENTIL) (2.5 MG/3ML) 0.083% nebulizer solution Take 3 mLs (2.5 mg total) by nebulization every 6 (six) hours as needed for wheezing or shortness of breath. 01/13/19   Lamptey, Myrene Galas, MD  benzonatate  (TESSALON) 200 MG capsule Take 1 capsule (200 mg total) by mouth 3 (three) times daily as needed for cough. Patient not taking: Reported on 05/22/2019 01/13/19   Chase Picket, MD  Cetirizine HCl 10 MG CAPS Take 1 capsule (10 mg total) by mouth daily for 10 days. 10/27/18 11/06/18  Wieters, Hallie C, PA-C  famotidine (PEPCID) 20 MG tablet Take 1 tablet (20 mg total) by mouth 2 (two) times daily. 11/16/18   Melynda Ripple, MD  fluticasone (FLONASE) 50 MCG/ACT nasal spray Place 1-2 sprays into both nostrils daily for 7 days. 10/27/18 11/03/18  Wieters, Hallie C, PA-C  HYDROcodone-Chlorpheniramine 5-4 MG/5ML SOLN Take 5 mLs by mouth at bedtime as needed. Patient not taking: Reported on 05/22/2019 11/17/18   Delia Heady, PA-C  ibuprofen (ADVIL,MOTRIN) 600 MG tablet Take 1 tablet (600 mg total) by mouth every 6 (six) hours as needed. 03/10/19   Raylene Everts, MD  loratadine (CLARITIN) 10 MG tablet Take 1 tablet (10 mg total) by mouth daily. One po daily x 5 days 01/16/19   Charlesetta Shanks, MD  montelukast (SINGULAIR) 10 MG tablet Take 1 tablet (10 mg total) by mouth at bedtime. 11/09/18   Albesa Seen, PA-C  Spacer/Aero-Holding Chambers (AEROCHAMBER PLUS) inhaler Use as instructed 11/16/18   Melynda Ripple, MD    Family History Family  History  Problem Relation Age of Onset  . Other Mother        has a hole in her heart    Social History Social History   Tobacco Use  . Smoking status: Never Smoker  . Smokeless tobacco: Never Used  Substance Use Topics  . Alcohol use: No  . Drug use: No     Allergies   Watermelon [citrullus vulgaris]   Review of Systems Review of Systems  All other systems reviewed and are negative.    Physical Exam Triage Vital Signs ED Triage Vitals  Enc Vitals Group     BP 05/22/19 1502 126/82     Pulse Rate 05/22/19 1502 94     Resp 05/22/19 1502 18     Temp 05/22/19 1502 98.3 F (36.8 C)     Temp Source 05/22/19 1502 Oral     SpO2 05/22/19  1502 98 %     Weight --      Height --      Head Circumference --      Peak Flow --      Pain Score 05/22/19 1503 6     Pain Loc --      Pain Edu? --      Excl. in GC? --    No data found.  Updated Vital Signs BP 126/82 (BP Location: Right Arm)   Pulse 94   Temp 98.3 F (36.8 C) (Oral)   Resp 18   LMP 05/10/2019 (Approximate) Comment: denies pregnancy  SpO2 98%   Visual Acuity Right Eye Distance:   Left Eye Distance:   Bilateral Distance:    Right Eye Near:   Left Eye Near:    Bilateral Near:     Physical Exam Vitals signs and nursing note reviewed.  Constitutional:      Appearance: She is well-developed.  HENT:     Head: Normocephalic.  Neck:     Musculoskeletal: Normal range of motion.  Cardiovascular:     Rate and Rhythm: Normal rate.  Pulmonary:     Effort: Pulmonary effort is normal.  Abdominal:     General: There is no distension.  Musculoskeletal: Normal range of motion.        General: Swelling and tenderness present.     Comments: Tender right knee,  Tender right foot,  Pain with range of motion,  nv and ns intact   Neurological:     Mental Status: She is alert and oriented to person, place, and time.      UC Treatments / Results  Labs (all labs ordered are listed, but only abnormal results are displayed) Labs Reviewed - No data to display  EKG None  Radiology No results found.  Procedures Procedures (including critical care time)  Medications Ordered in UC Medications - No data to display  Initial Impression / Assessment and Plan / UC Course  I have reviewed the triage vital signs and the nursing notes.  Pertinent labs & imaging results that were available during my care of the patient were reviewed by me and considered in my medical decision making (see chart for details).     MDM  Xrays reviewed.  No fracture Pt counseled on results Final Clinical Impressions(s) / UC Diagnoses   Final diagnoses:  Acute pain of right knee   Sprain of right foot, initial encounter   Discharge Instructions   None    ED Prescriptions    None     Controlled Substance Prescriptions   Controlled Substance Registry consulted? Not Applicable  An After Visit Summary was printed and given to the patient.    Elson AreasSofia,  K, [place] JerseyPA-C 05/24/19 2309

## 2019-07-19 ENCOUNTER — Emergency Department (HOSPITAL_COMMUNITY)
Admission: EM | Admit: 2019-07-19 | Discharge: 2019-07-19 | Discharge status: Against Medical Advice | Payer: Medicaid Other | Attending: Emergency Medicine | Admitting: Emergency Medicine

## 2019-07-19 ENCOUNTER — Encounter (HOSPITAL_COMMUNITY): Payer: Self-pay

## 2019-07-19 ENCOUNTER — Other Ambulatory Visit: Payer: Self-pay

## 2019-07-19 DIAGNOSIS — Z5321 Procedure and treatment not carried out due to patient leaving prior to being seen by health care provider: Secondary | ICD-10-CM | POA: Insufficient documentation

## 2019-07-19 DIAGNOSIS — R55 Syncope and collapse: Secondary | ICD-10-CM | POA: Diagnosis present

## 2019-07-19 LAB — URINALYSIS, ROUTINE W REFLEX MICROSCOPIC
Bacteria, UA: NONE SEEN
Bilirubin Urine: NEGATIVE
Glucose, UA: NEGATIVE mg/dL
Hgb urine dipstick: NEGATIVE
Ketones, ur: NEGATIVE mg/dL
Leukocytes,Ua: NEGATIVE
Nitrite: NEGATIVE
Protein, ur: NEGATIVE mg/dL
Specific Gravity, Urine: 1.015 (ref 1.005–1.030)
pH: 7 (ref 5.0–8.0)

## 2019-07-19 LAB — CBC
HCT: 41.7 % (ref 36.0–46.0)
Hemoglobin: 13 g/dL (ref 12.0–15.0)
MCH: 25.1 pg — ABNORMAL LOW (ref 26.0–34.0)
MCHC: 31.2 g/dL (ref 30.0–36.0)
MCV: 80.7 fL (ref 80.0–100.0)
Platelets: 276 10*3/uL (ref 150–400)
RBC: 5.17 MIL/uL — ABNORMAL HIGH (ref 3.87–5.11)
RDW: 13.9 % (ref 11.5–15.5)
WBC: 5.9 10*3/uL (ref 4.0–10.5)
nRBC: 0 % (ref 0.0–0.2)

## 2019-07-19 LAB — BASIC METABOLIC PANEL
Anion gap: 10 (ref 5–15)
BUN: 12 mg/dL (ref 6–20)
CO2: 23 mmol/L (ref 22–32)
Calcium: 9.1 mg/dL (ref 8.9–10.3)
Chloride: 101 mmol/L (ref 98–111)
Creatinine, Ser: 0.72 mg/dL (ref 0.44–1.00)
GFR calc Af Amer: >60 mL/min (ref >60)
GFR calc non Af Amer: >60 mL/min (ref >60)
Glucose, Bld: 99 mg/dL (ref 70–99)
Potassium: 4.5 mmol/L (ref 3.5–5.1)
Sodium: 134 mmol/L — ABNORMAL LOW (ref 135–145)

## 2019-07-19 NOTE — ED Notes (Signed)
Pt. States her ride is here and she is going to leave because she is "tired."

## 2019-07-19 NOTE — ED Triage Notes (Signed)
Pt BIB [facility] for eval of HA and feeling pre syncopal x 4 days. Denies CP/SOB/N/V.

## 2019-11-27 ENCOUNTER — Ambulatory Visit (INDEPENDENT_AMBULATORY_CARE_PROVIDER_SITE_OTHER): Payer: Medicaid Other

## 2019-11-27 ENCOUNTER — Other Ambulatory Visit: Payer: Self-pay

## 2019-11-27 ENCOUNTER — Ambulatory Visit (HOSPITAL_COMMUNITY)
Admission: EM | Admit: 2019-11-27 | Discharge: 2019-11-27 | Disposition: A | Discharge status: Home / Self Care | Payer: Medicaid Other | Attending: Emergency Medicine | Admitting: Emergency Medicine

## 2019-11-27 ENCOUNTER — Encounter (HOSPITAL_COMMUNITY): Payer: Self-pay

## 2019-11-27 DIAGNOSIS — I10 Essential (primary) hypertension: Secondary | ICD-10-CM

## 2019-11-27 DIAGNOSIS — M25561 Pain in right knee: Secondary | ICD-10-CM

## 2019-11-27 MED ORDER — IBUPROFEN 800 MG PO TABS: 800.0000 mg | ORAL_TABLET | Freq: Three times a day (TID) | ORAL | 0 refills | Status: DC

## 2019-11-27 MED ORDER — IBUPROFEN 800 MG PO TABS
800.0000 mg | ORAL_TABLET | Freq: Once | ORAL | Status: AC
  Administered 2019-11-27: 12:00:00 800 mg via ORAL

## 2019-11-27 MED ORDER — IBUPROFEN 800 MG PO TABS
ORAL_TABLET | ORAL | Status: AC
  Filled 2019-11-27: qty 1

## 2019-11-27 NOTE — Discharge Instructions (Signed)
Use anti-inflammatories for pain/swelling. You may take up to 800 mg Ibuprofen every 8 hours with food. You may supplement Ibuprofen with Tylenol [phone] mg every 8 hours.   No fractures on Xray  Ice and elevate

## 2019-11-27 NOTE — ED Triage Notes (Signed)
Pt states she has right  Knee pain. Pt states she hit her knee a pallet at work 3 am this morning.

## 2019-11-27 NOTE — ED Provider Notes (Signed)
[place]    CSN: [id number] Arrival date & time: 11/27/19  1050      History   Chief Complaint Chief Complaint  Patient presents with  . Knee Pain    HPI Jonay Hitchcock is a 18 y.o. female history of hypertension, presenting today for evaluation of right knee pain.  Earlier today at work she jammed her right knee into a pallet at work.  States that she hit the corner of the palate while walking.  Feels there was a lot of force behind this injury.  Notes that over the past few months she has had 4 previous injuries to this knee causing sprains.  Since accident happened around 3 AM she has had pain with weightbearing and bending.  She has taken Tylenol when it first happened.  Denies prior fracture or ligament injury.   HPI  Past Medical History:  Diagnosis Date  . Headache    at her period   . Hypertension    as a child (had to diet to get bp down, no longer an issue)  . PTSD (post-traumatic stress disorder) 07/18/2016    Patient Active Problem List   Diagnosis Date Noted  . Bacterial vaginosis 09/20/2018  . PTSD (post-traumatic stress disorder) 07/18/2016  . MDD (major depressive disorder), recurrent episode, severe ([place]) 07/17/2016    Past Surgical History:  Procedure Laterality Date  . CHOLECYSTECTOMY N/A 02/08/2018   Procedure: LAPAROSCOPIC CHOLECYSTECTOMY;  Surgeon: Ralene Ok, MD;  Location: [facility];  Service: General;  Laterality: N/A;  . TONSILECTOMY, ADENOIDECTOMY, BILATERAL MYRINGOTOMY AND TUBES    . TONSILLECTOMY     age 49  . WISDOM TOOTH EXTRACTION      OB History   No obstetric history on file.      Home Medications    Prior to Admission medications   Medication Sig Start Date End Date Taking? Authorizing Provider  acetaminophen (TYLENOL) 325 MG tablet Take 2 tablets (650 mg total) by mouth every 6 (six) hours as needed. 01/13/19   Chase Picket, MD  albuterol (PROVENTIL) (2.5 MG/3ML) 0.083% nebulizer solution Take 3 mLs (2.5 mg  total) by nebulization every 6 (six) hours as needed for wheezing or shortness of breath. 01/13/19   Lamptey, Myrene Galas, MD  Cetirizine HCl 10 MG CAPS Take 1 capsule (10 mg total) by mouth daily for 10 days. 10/27/18 11/06/18  Aria Jarrard C, PA-C  famotidine (PEPCID) 20 MG tablet Take 1 tablet (20 mg total) by mouth 2 (two) times daily. 11/16/18   Melynda Ripple, MD  fluticasone (FLONASE) 50 MCG/ACT nasal spray Place 1-2 sprays into both nostrils daily for 7 days. 10/27/18 11/03/18  Glessie Eustice C, PA-C  ibuprofen (ADVIL) 800 MG tablet Take 1 tablet (800 mg total) by mouth 3 (three) times daily. 11/27/19   Demetries Coia C, PA-C  loratadine (CLARITIN) 10 MG tablet Take 1 tablet (10 mg total) by mouth daily. One po daily x 5 days 01/16/19   Charlesetta Shanks, MD  montelukast (SINGULAIR) 10 MG tablet Take 1 tablet (10 mg total) by mouth at bedtime. 11/09/18   Langston Masker B, PA-C  Spacer/Aero-Holding Chambers (AEROCHAMBER PLUS) inhaler Use as instructed 11/16/18   Melynda Ripple, MD    Family History Family History  Problem Relation Age of Onset  . Other Mother        has a hole in her heart    Social History Social History   Tobacco Use  . Smoking status: Never Smoker  .  Smokeless tobacco: Never Used  Substance Use Topics  . Alcohol use: No  . Drug use: No     Allergies   Watermelon [citrullus vulgaris]   Review of Systems Review of Systems  Constitutional: Negative for fatigue and fever.  Eyes: Negative for visual disturbance.  Respiratory: Negative for shortness of breath.   Cardiovascular: Negative for chest pain.  Gastrointestinal: Negative for abdominal pain, nausea and vomiting.  Musculoskeletal: Positive for arthralgias and gait problem. Negative for joint swelling.  Skin: Negative for color change, rash and wound.  Neurological: Negative for dizziness, weakness, light-headedness and headaches.     Physical Exam Triage Vital Signs ED Triage Vitals  Enc  Vitals Group     BP 11/27/19 1129 (!) 149/96     Pulse Rate 11/27/19 1129 92     Resp 11/27/19 1129 18     Temp 11/27/19 1129 98.4 F (36.9 C)     Temp src --      SpO2 11/27/19 1129 100 %     Weight 11/27/19 1125 (!) 315 lb (142.9 kg)     Height --      Head Circumference --      Peak Flow --      Pain Score 11/27/19 1125 10     Pain Loc --      Pain Edu? --      Excl. in [place]? --    No data found.  Updated Vital Signs BP (!) 149/96 (BP Location: Right Arm)   Pulse 92   Temp 98.4 F (36.9 C)   Resp 18   Wt (!) 315 lb (142.9 kg)   LMP 11/19/2019   SpO2 100%   BMI 51.62 kg/m   Visual Acuity Right Eye Distance:   Left Eye Distance:   Bilateral Distance:    Right Eye Near:   Left Eye Near:    Bilateral Near:     Physical Exam Vitals and nursing note reviewed.  Constitutional:      Appearance: She is well-developed.     Comments: No acute distress  HENT:     Head: Normocephalic and atraumatic.     Nose: Nose normal.  Eyes:     Conjunctiva/sclera: Conjunctivae normal.  Cardiovascular:     Rate and Rhythm: Normal rate.  Pulmonary:     Effort: Pulmonary effort is normal. No respiratory distress.  Abdominal:     General: There is no distension.  Musculoskeletal:        General: Normal range of motion.     Cervical back: Neck supple.     Comments: Ambulating with antalgia, but able to ambulate from chair to exam table without assistance  Right knee: Mild swelling over knee, tender to palpation along lateral joint line and significantly over patella/anterior knee, does not extend into patellar tendon or suprapatellar area, nontender along medial joint line, nontender and popliteal area; significant discomfort elicited with knee flexion, limited.  No laxity appreciated with varus and valgus stress, negative Lachman's, negative McMurray's.  No calf tenderness erythema or swelling  Skin:    General: Skin is warm and dry.  Neurological:     Mental Status: She is  alert and oriented to person, place, and time.      UC Treatments / Results  Labs (all labs ordered are listed, but only abnormal results are displayed) Labs Reviewed - No data to display  EKG   Radiology DG Knee Complete 4 Views Right  Result Date: 11/27/2019 CLINICAL DATA:  Injured knee on palate. Anterior knee pain and limited range of motion. Initial encounter. EXAM: RIGHT KNEE - COMPLETE 4+ VIEW COMPARISON:  None. FINDINGS: No evidence of fracture, dislocation, or joint effusion. No evidence of arthropathy or other focal bone abnormality. Soft tissues are unremarkable. IMPRESSION: Negative. Electronically Signed   By: Danae Orleans M.D.   On: 11/27/2019 12:51    Procedures Procedures (including critical care time)  Medications Ordered in UC Medications  ibuprofen (ADVIL) tablet 800 mg (800 mg Oral Given 11/27/19 1209)    Initial Impression / Assessment and Plan / UC Course  I have reviewed the triage vital signs and the nursing notes.  Pertinent labs & imaging results that were available during my care of the patient were reviewed by me and considered in my medical decision making (see chart for details).     X-ray negative for acute bony abnormality.  Most likely contusion versus sprain.  Recommending anti-inflammatories, ice, elevation and Ace wrap for comfort.  Provided crutches per her request, weight-bear as tolerated.  Do not suspect ligamentous injury at this time.  Continue to monitor.  Discussed strict return precautions. Patient verbalized understanding and is agreeable with plan.  Final Clinical Impressions(s) / UC Diagnoses   Final diagnoses:  Acute pain of right knee     Discharge Instructions     Use anti-inflammatories for pain/swelling. You may take up to 800 mg Ibuprofen every 8 hours with food. You may supplement Ibuprofen with Tylenol [phone] mg every 8 hours.   No fractures on Xray  Ice and elevate    ED Prescriptions    Medication Sig  Dispense Auth. Provider   ibuprofen (ADVIL) 800 MG tablet Take 1 tablet (800 mg total) by mouth 3 (three) times daily. 21 tablet Lylith Bebeau, [place] C, PA-C     PDMP not reviewed this encounter.   Lew Dawes, PA-C 11/27/19 1346

## 2019-12-26 ENCOUNTER — Other Ambulatory Visit: Payer: Self-pay

## 2019-12-26 ENCOUNTER — Encounter (HOSPITAL_COMMUNITY): Payer: Self-pay | Admitting: Emergency Medicine

## 2019-12-26 ENCOUNTER — Ambulatory Visit (HOSPITAL_COMMUNITY)
Admission: EM | Admit: 2019-12-26 | Discharge: 2019-12-26 | Disposition: A | Discharge status: Home / Self Care | Payer: Medicaid Other | Attending: Internal Medicine | Admitting: Internal Medicine

## 2019-12-26 DIAGNOSIS — Z20822 Contact with and (suspected) exposure to covid-19: Secondary | ICD-10-CM | POA: Insufficient documentation

## 2019-12-26 DIAGNOSIS — B349 Viral infection, unspecified: Secondary | ICD-10-CM | POA: Diagnosis present

## 2019-12-26 MED ORDER — MONTELUKAST SODIUM 10 MG PO TABS: 10.0000 mg | ORAL_TABLET | Freq: Every day | ORAL | 1 refills | Status: DC

## 2019-12-26 MED ORDER — ALBUTEROL SULFATE HFA 108 (90 BASE) MCG/ACT IN AERS: 1.0000 | INHALATION_SPRAY | Freq: Four times a day (QID) | RESPIRATORY_TRACT | 0 refills | Status: DC | PRN

## 2019-12-26 MED ORDER — FLUTICASONE PROPIONATE 50 MCG/ACT NA SUSP: 1.0000 | Freq: Every day | NASAL | 2 refills | Status: DC

## 2019-12-26 MED ORDER — CETIRIZINE HCL 10 MG PO TABS: 10.0000 mg | ORAL_TABLET | Freq: Every day | ORAL | 0 refills | Status: DC

## 2019-12-26 NOTE — Discharge Instructions (Signed)
We have tested you for COVID  °Go home and quarantine until we get our results.  °Work note given °You can take OTC medications as needed.  ° °

## 2019-12-26 NOTE — ED Triage Notes (Signed)
Pt sts exposure to covid at work and sts some chills; no fever at present

## 2019-12-27 NOTE — ED Provider Notes (Signed)
[facility]    CSN: [id number] Arrival date & time: 12/26/19  1100      History   Chief Complaint Chief Complaint  Patient presents with  . Chills    HPI Ebony Wilson is a 19 y.o. female.   Patient is a 19 year old female presents today with chills, nasal congestion and rhinorrhea.  Symptoms been constant for the past couple days.  Denies any other symptoms to include body aches, fever, night sweats, cough, chest congestion, sore throat, ear pain, diarrhea or loss of taste or smell.  Reporting she was exposed to Covid at work and would like to be tested.  History of allergies.   ROS per HPI      Past Medical History:  Diagnosis Date  . Headache    at her period   . Hypertension    as a child (had to diet to get bp down, no longer an issue)  . PTSD (post-traumatic stress disorder) 07/18/2016    Patient Active Problem List   Diagnosis Date Noted  . Bacterial vaginosis 09/20/2018  . PTSD (post-traumatic stress disorder) 07/18/2016  . MDD (major depressive disorder), recurrent episode, severe ([facility]) 07/17/2016    Past Surgical History:  Procedure Laterality Date  . CHOLECYSTECTOMY N/A 02/08/2018   Procedure: LAPAROSCOPIC CHOLECYSTECTOMY;  Surgeon: Axel Filler, MD;  Location: [facility] OR;  Service: General;  Laterality: N/A;  . TONSILECTOMY, ADENOIDECTOMY, BILATERAL MYRINGOTOMY AND TUBES    . TONSILLECTOMY     age 53  . WISDOM TOOTH EXTRACTION      OB History   No obstetric history on file.      Home Medications    Prior to Admission medications   Medication Sig Start Date End Date Taking? Authorizing Provider  acetaminophen (TYLENOL) 325 MG tablet Take 2 tablets (650 mg total) by mouth every 6 (six) hours as needed. 01/13/19   Lamptey, Britta Mccreedy, MD  albuterol (VENTOLIN HFA) 108 (90 Base) MCG/ACT inhaler Inhale 1-2 puffs into the lungs every 6 (six) hours as needed for wheezing or shortness of breath. 12/26/19   Dahlia Byes A, NP  cetirizine (ZYRTEC)  10 MG tablet Take 1 tablet (10 mg total) by mouth daily. 12/26/19   Lakeya Mulka, Gloris Manchester A, NP  fluticasone (FLONASE) 50 MCG/ACT nasal spray Place 1 spray into both nostrils daily. 12/26/19   Reichen Hutzler, Gloris Manchester A, NP  montelukast (SINGULAIR) 10 MG tablet Take 1 tablet (10 mg total) by mouth at bedtime. 12/26/19   Dahlia Byes A, NP  famotidine (PEPCID) 20 MG tablet Take 1 tablet (20 mg total) by mouth 2 (two) times daily. 11/16/18 12/26/19  Domenick Gong, MD  loratadine (CLARITIN) 10 MG tablet Take 1 tablet (10 mg total) by mouth daily. One po daily x 5 days 01/16/19 12/26/19  Arby Barrette, MD    Family History Family History  Problem Relation Age of Onset  . Other Mother        has a hole in her heart    Social History Social History   Tobacco Use  . Smoking status: Never Smoker  . Smokeless tobacco: Never Used  Substance Use Topics  . Alcohol use: No  . Drug use: No     Allergies   Watermelon [citrullus vulgaris]   Review of Systems Review of Systems   Physical Exam Triage Vital Signs ED Triage Vitals  Enc Vitals Group     BP 12/26/19 1124 (!) 153/90     Pulse Rate 12/26/19 1124 82  Resp 12/26/19 1124 18     Temp 12/26/19 1124 98.1 F (36.7 C)     Temp Source 12/26/19 1124 Oral     SpO2 12/26/19 1124 100 %     Weight --      Height --      Head Circumference --      Peak Flow --      Pain Score 12/26/19 1125 6     Pain Loc --      Pain Edu? --      Excl. in [place]? --    No data found.  Updated Vital Signs BP (!) 153/90 (BP Location: Left Arm)   Pulse 82   Temp 98.1 F (36.7 C) (Oral)   Resp 18   SpO2 100%   Visual Acuity Right Eye Distance:   Left Eye Distance:   Bilateral Distance:    Right Eye Near:   Left Eye Near:    Bilateral Near:     Physical Exam Vitals and nursing note reviewed.  Constitutional:      General: She is not in acute distress.    Appearance: Normal appearance. She is not ill-appearing, toxic-appearing or diaphoretic.  HENT:      Head: Normocephalic.     Nose: Nose normal.     Mouth/Throat:     Pharynx: Oropharynx is clear.  Eyes:     Conjunctiva/sclera: Conjunctivae normal.  Pulmonary:     Effort: Pulmonary effort is normal.  Musculoskeletal:        General: Normal range of motion.     Cervical back: Normal range of motion.  Skin:    General: Skin is warm and dry.     Findings: No rash.  Neurological:     Mental Status: She is alert.  Psychiatric:        Mood and Affect: Mood normal.      UC Treatments / Results  Labs (all labs ordered are listed, but only abnormal results are displayed) Labs Reviewed  NOVEL CORONAVIRUS, NAA (HOSP ORDER, SEND-OUT TO REF LAB; TAT 18-24 HRS)    EKG   Radiology No results found.  Procedures Procedures (including critical care time)  Medications Ordered in UC Medications - No data to display  Initial Impression / Assessment and Plan / UC Course  I have reviewed the triage vital signs and the nursing notes.  Pertinent labs & imaging results that were available during my care of the patient were reviewed by me and considered in my medical decision making (see chart for details).     Viral illness vs allergies -Covid swab sent for testing with labs pending. Precautions given  Work note given  Refill patient's allergy medications and albuterol as requested Follow up as needed for continued or worsening symptoms  Final Clinical Impressions(s) / UC Diagnoses   Final diagnoses:  Viral illness     Discharge Instructions     We have tested you for COVID  Go home and quarantine until we get our results.  Work note given You can take OTC medications as needed.      ED Prescriptions    Medication Sig Dispense Auth. Provider   fluticasone (FLONASE) 50 MCG/ACT nasal spray Place 1 spray into both nostrils daily. 16 g Edmonia Gonser A, NP   cetirizine (ZYRTEC) 10 MG tablet Take 1 tablet (10 mg total) by mouth daily. 30 tablet Kamel Haven A, NP    montelukast (SINGULAIR) 10 MG tablet Take 1 tablet (10 mg total) by  mouth at bedtime. 30 tablet Itzae Miralles A, NP   albuterol (VENTOLIN HFA) 108 (90 Base) MCG/ACT inhaler Inhale 1-2 puffs into the lungs every 6 (six) hours as needed for wheezing or shortness of breath. 18 g Dahlia Byes A, NP     PDMP not reviewed this encounter.   Janace Aris, NP 12/27/19 1108

## 2019-12-28 LAB — NOVEL CORONAVIRUS, NAA (HOSP ORDER, SEND-OUT TO REF LAB; TAT 18-24 HRS): SARS-CoV-2, NAA: NOT DETECTED

## 2020-03-31 IMAGING — DX DG CHEST 2V
2 series · 2 of 2 positions shown · non-contrast
Comparison: 03/28/2015

CLINICAL DATA: Cough and shortness of breath for 1 month

EXAM:
CHEST - 2 VIEW

[chest pa]
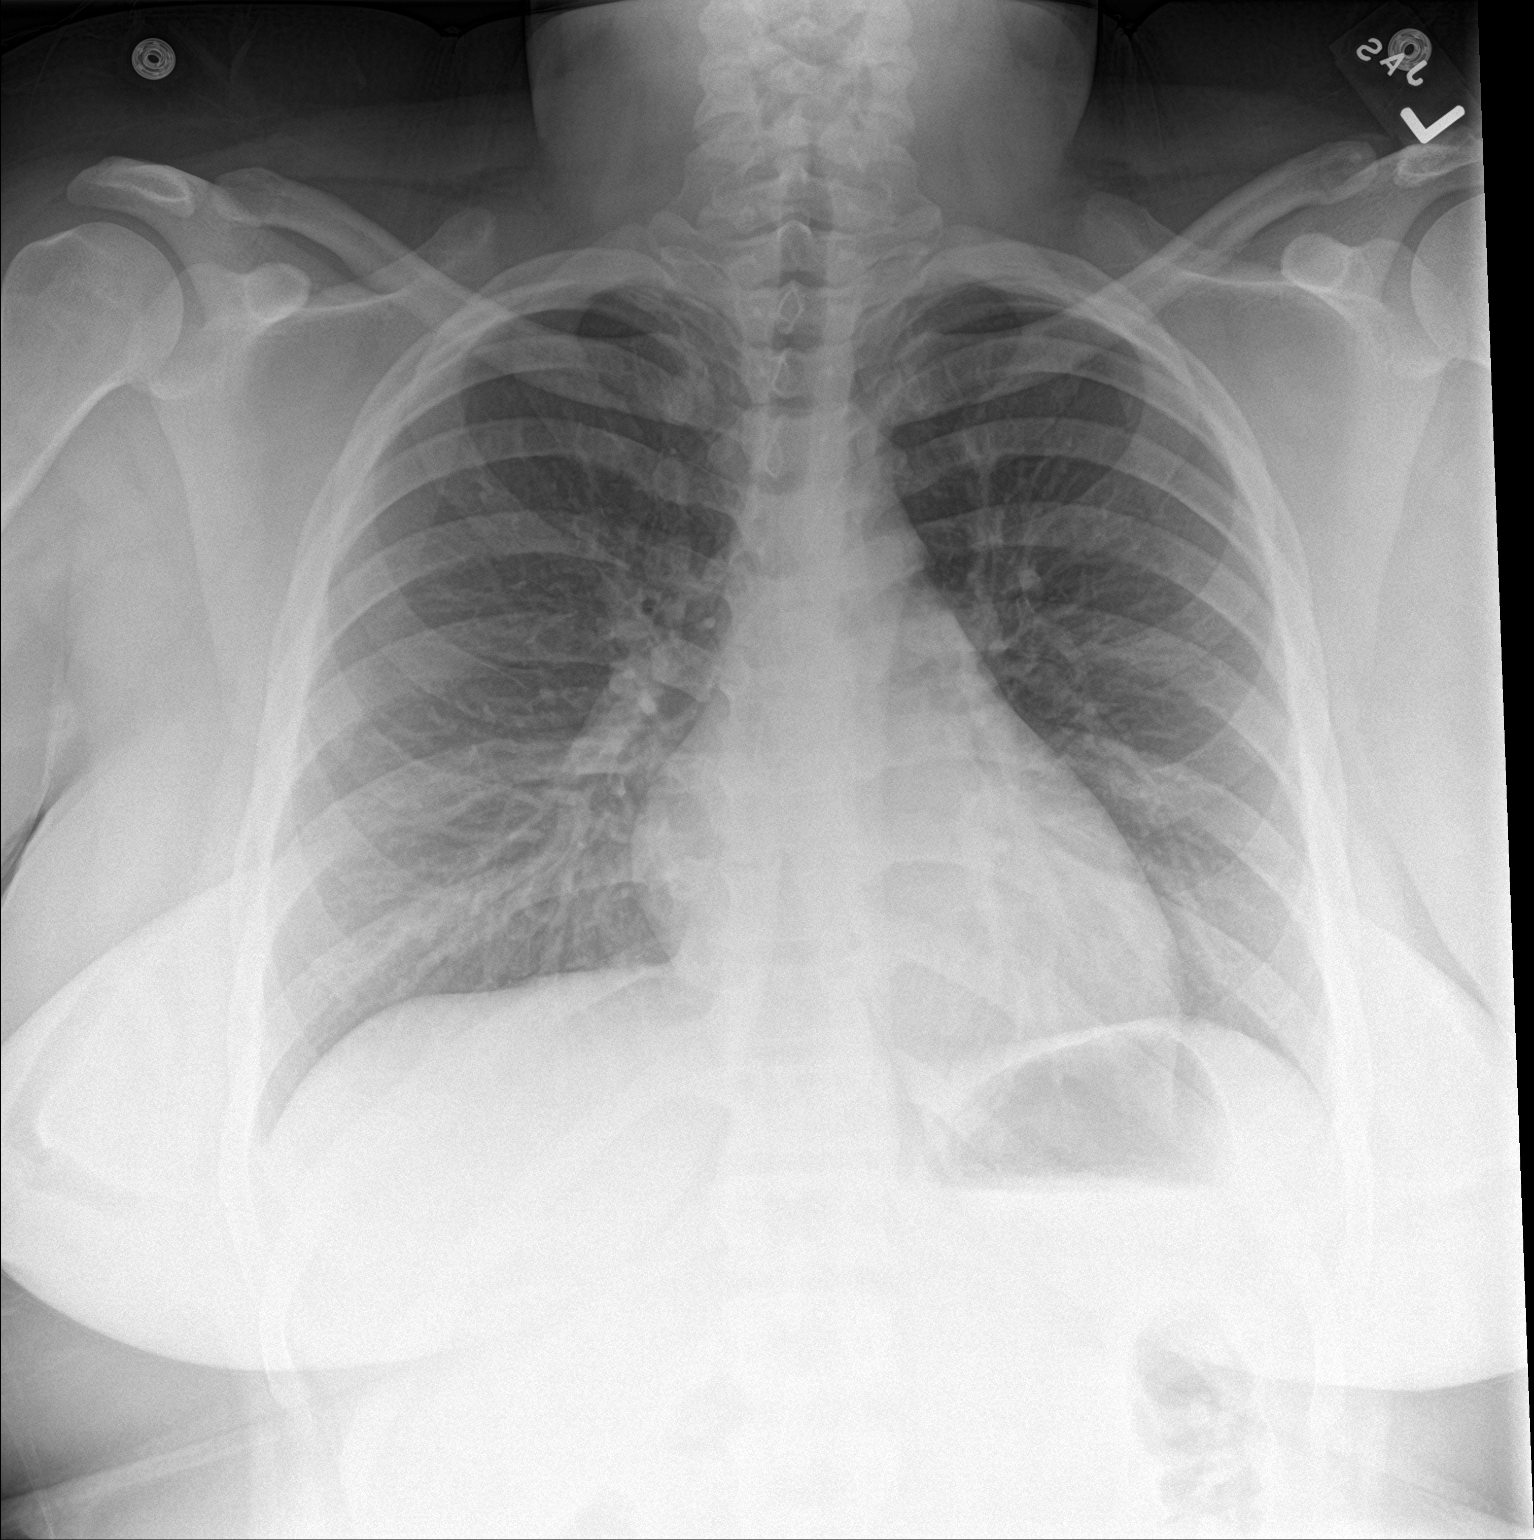

[chest lat]
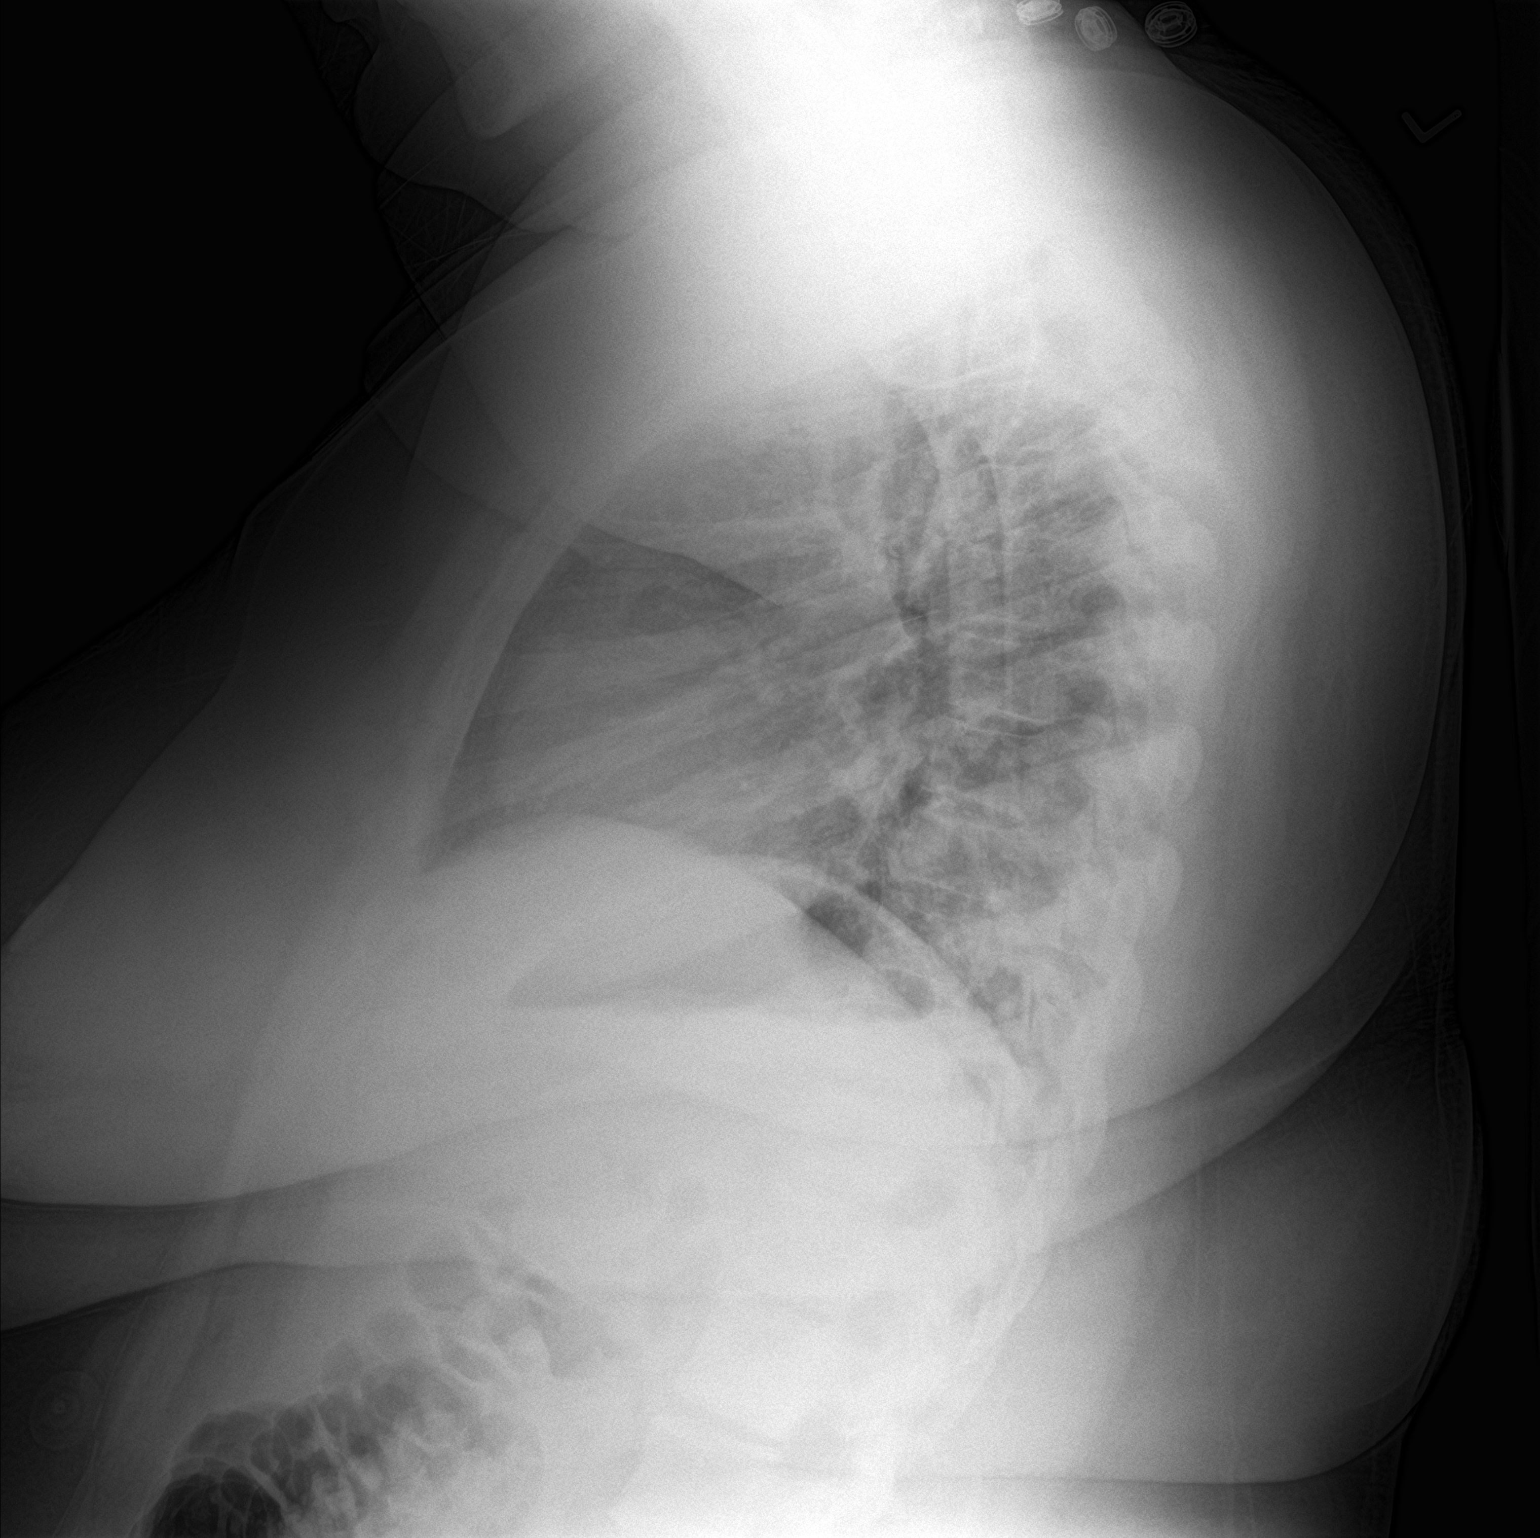

[2 of 2 positions shown; findings below may reference images not displayed]

FINDINGS: Airway thickening is present, suggesting bronchitis or reactive
airways disease. Cardiac and mediastinal margins appear normal. No
airspace opacity observed. No pleural effusion.
IMPRESSION: 1. Airway thickening is present, suggesting bronchitis or reactive
airways disease.

## 2020-06-06 DIAGNOSIS — Z419 Encounter for procedure for purposes other than remedying health state, unspecified: Secondary | ICD-10-CM | POA: Diagnosis not present

## 2020-07-07 DIAGNOSIS — Z419 Encounter for procedure for purposes other than remedying health state, unspecified: Secondary | ICD-10-CM | POA: Diagnosis not present

## 2020-07-09 ENCOUNTER — Other Ambulatory Visit: Payer: Self-pay

## 2020-07-09 ENCOUNTER — Emergency Department (HOSPITAL_COMMUNITY)
Admission: EM | Admit: 2020-07-09 | Discharge: 2020-07-10 | Disposition: A | Discharge status: Home / Self Care | Payer: Medicaid Other | Attending: Emergency Medicine | Admitting: Emergency Medicine

## 2020-07-09 ENCOUNTER — Encounter (HOSPITAL_COMMUNITY): Payer: Self-pay | Admitting: Emergency Medicine

## 2020-07-09 DIAGNOSIS — I10 Essential (primary) hypertension: Secondary | ICD-10-CM | POA: Insufficient documentation

## 2020-07-09 DIAGNOSIS — R05 Cough: Secondary | ICD-10-CM | POA: Diagnosis not present

## 2020-07-09 DIAGNOSIS — R112 Nausea with vomiting, unspecified: Secondary | ICD-10-CM | POA: Diagnosis not present

## 2020-07-09 DIAGNOSIS — R197 Diarrhea, unspecified: Secondary | ICD-10-CM

## 2020-07-09 DIAGNOSIS — R059 Cough, unspecified: Secondary | ICD-10-CM

## 2020-07-09 DIAGNOSIS — U071 COVID-19: Secondary | ICD-10-CM | POA: Diagnosis not present

## 2020-07-09 DIAGNOSIS — R519 Headache, unspecified: Secondary | ICD-10-CM | POA: Diagnosis present

## 2020-07-09 DIAGNOSIS — H53149 Visual discomfort, unspecified: Secondary | ICD-10-CM | POA: Insufficient documentation

## 2020-07-09 HISTORY — DX: COVID-19: U07.1

## 2020-07-09 NOTE — ED Triage Notes (Signed)
Patient reports HA, subjective fevers, body aches/chills. Patient is NOT vaccinated against COVID.

## 2020-07-10 ENCOUNTER — Encounter (HOSPITAL_COMMUNITY): Payer: Self-pay

## 2020-07-10 DIAGNOSIS — R112 Nausea with vomiting, unspecified: Secondary | ICD-10-CM | POA: Diagnosis not present

## 2020-07-10 DIAGNOSIS — R05 Cough: Secondary | ICD-10-CM | POA: Diagnosis not present

## 2020-07-10 DIAGNOSIS — U071 COVID-19: Secondary | ICD-10-CM | POA: Diagnosis not present

## 2020-07-10 DIAGNOSIS — R197 Diarrhea, unspecified: Secondary | ICD-10-CM | POA: Diagnosis not present

## 2020-07-10 LAB — SARS CORONAVIRUS 2 BY RT PCR (HOSPITAL ORDER, PERFORMED IN ~~LOC~~ HOSPITAL LAB): SARS Coronavirus 2: POSITIVE — AB

## 2020-07-10 MED ORDER — ONDANSETRON 4 MG PO TBDP
4.0000 mg | ORAL_TABLET | Freq: Once | ORAL | Status: AC
  Administered 2020-07-10: 4 mg via ORAL
  Filled 2020-07-10: qty 1

## 2020-07-10 MED ORDER — ONDANSETRON 4 MG PO TBDP: 4.0000 mg | ORAL_TABLET | Freq: Three times a day (TID) | ORAL | 0 refills | Status: DC | PRN

## 2020-07-10 MED ORDER — ONDANSETRON HCL 4 MG PO TABS
4.0000 mg | ORAL_TABLET | Freq: Once | ORAL | Status: AC
  Administered 2020-07-10: 4 mg via ORAL
  Filled 2020-07-10: qty 1

## 2020-07-10 MED ORDER — ACETAMINOPHEN 325 MG PO TABS
650.0000 mg | ORAL_TABLET | Freq: Once | ORAL | Status: AC
  Administered 2020-07-10: 650 mg via ORAL
  Filled 2020-07-10: qty 2

## 2020-07-10 NOTE — Discharge Instructions (Addendum)
Your work-up today confirm you do have COVID-19 as the cause of your symptoms.  Your oxygen saturation was reassuring on room air.  We do not feel he needs admission at this time.  Please use the nausea medicine to help maintain hydration and rest.  Please self isolate.  Please follow-up with your primary doctor.  If any symptoms change or worsen, please return to the nearest emergency department immediately.      Person Under Monitoring Name: Ebony Wilson  Location: [address] [place] [address]   Infection Prevention Recommendations for Individuals Confirmed to have, or Being Evaluated for, 2019 Novel Coronavirus (COVID-19) Infection Who Receive Care at Home  Individuals who are confirmed to have, or are being evaluated for, COVID-19 should follow the prevention steps below until a healthcare provider or local or state health department says they can return to normal activities.  Stay home except to get medical care You should restrict activities outside your home, except for getting medical care. Do not go to work, school, or public areas, and do not use public transportation or taxis.  Call ahead before visiting your doctor Before your medical appointment, call the healthcare provider and tell them that you have, or are being evaluated for, COVID-19 infection. This will help the healthcare provider's office take steps to keep other people from getting infected. Ask your healthcare provider to call the local or state health department.  Monitor your symptoms Seek prompt medical attention if your illness is worsening (e.g., difficulty breathing). Before going to your medical appointment, call the healthcare provider and tell them that you have, or are being evaluated for, COVID-19 infection. Ask your healthcare provider to call the local or state health department.  Wear a facemask You should wear a facemask that covers your nose and mouth when you are in the same  room with other people and when you visit a healthcare provider. People who live with or visit you should also wear a facemask while they are in the same room with you.  Separate yourself from other people in your home As much as possible, you should stay in a different room from other people in your home. Also, you should use a separate bathroom, if available.  Avoid sharing household items You should not share dishes, drinking glasses, cups, eating utensils, towels, bedding, or other items with other people in your home. After using these items, you should wash them thoroughly with soap and water.  Cover your coughs and sneezes Cover your mouth and nose with a tissue when you cough or sneeze, or you can cough or sneeze into your sleeve. Throw used tissues in a lined trash can, and immediately wash your hands with soap and water for at least 20 seconds or use an alcohol-based hand rub.  Wash your [employer] your hands often and thoroughly with soap and water for at least 20 seconds. You can use an alcohol-based hand sanitizer if soap and water are not available and if your hands are not visibly dirty. Avoid touching your eyes, nose, and mouth with unwashed hands.   Prevention Steps for Caregivers and Household Members of Individuals Confirmed to have, or Being Evaluated for, COVID-19 Infection Being Cared for in the Home  If you live with, or provide care at home for, a person confirmed to have, or being evaluated for, COVID-19 infection please follow these guidelines to prevent infection:  Follow healthcare provider's instructions Make sure that you understand and can help the  patient follow any healthcare provider instructions for all care.  Provide for the patient's basic needs You should help the patient with basic needs in the home and provide support for getting groceries, prescriptions, and other personal needs.  Monitor the patient's symptoms If they are getting sicker,  call his or her medical provider and tell them that the patient has, or is being evaluated for, COVID-19 infection. This will help the healthcare provider's office take steps to keep other people from getting infected. Ask the healthcare provider to call the local or state health department.  Limit the number of people who have contact with the patient If possible, have only one caregiver for the patient. Other household members should stay in another home or place of residence. If this is not possible, they should stay in another room, or be separated from the patient as much as possible. Use a separate bathroom, if available. Restrict visitors who do not have an essential need to be in the home.  Keep older adults, very young children, and other sick people away from the patient Keep older adults, very young children, and those who have compromised immune systems or chronic health conditions away from the patient. This includes people with chronic heart, lung, or kidney conditions, diabetes, and cancer.  Ensure good ventilation Make sure that shared spaces in the home have good air flow, such as from an air conditioner or an opened window, weather permitting.  Wash your hands often Wash your hands often and thoroughly with soap and water for at least 20 seconds. You can use an alcohol based hand sanitizer if soap and water are not available and if your hands are not visibly dirty. Avoid touching your eyes, nose, and mouth with unwashed hands. Use disposable paper towels to dry your hands. If not available, use dedicated cloth towels and replace them when they become wet.  Wear a facemask and gloves Wear a disposable facemask at all times in the room and gloves when you touch or have contact with the patient's blood, body fluids, and/or secretions or excretions, such as sweat, saliva, sputum, nasal mucus, vomit, urine, or feces.  Ensure the mask fits over your nose and mouth tightly, and do  not touch it during use. Throw out disposable facemasks and gloves after using them. Do not reuse. Wash your hands immediately after removing your facemask and gloves. If your personal clothing becomes contaminated, carefully remove clothing and launder. Wash your hands after handling contaminated clothing. Place all used disposable facemasks, gloves, and other waste in a lined container before disposing them with other household waste. Remove gloves and wash your hands immediately after handling these items.  Do not share dishes, glasses, or other household items with the patient Avoid sharing household items. You should not share dishes, drinking glasses, cups, eating utensils, towels, bedding, or other items with a patient who is confirmed to have, or being evaluated for, COVID-19 infection. After the person uses these items, you should wash them thoroughly with soap and water.  Wash laundry thoroughly Immediately remove and wash clothes or bedding that have blood, body fluids, and/or secretions or excretions, such as sweat, saliva, sputum, nasal mucus, vomit, urine, or feces, on them. Wear gloves when handling laundry from the patient. Read and follow directions on labels of laundry or clothing items and detergent. In general, wash and dry with the warmest temperatures recommended on the label.  Clean all areas the individual has used often Clean all touchable surfaces, such  as counters, tabletops, doorknobs, bathroom fixtures, toilets, phones, keyboards, tablets, and bedside tables, every day. Also, clean any surfaces that may have blood, body fluids, and/or secretions or excretions on them. Wear gloves when cleaning surfaces the patient has come in contact with. Use a diluted bleach solution (e.g., dilute bleach with 1 part bleach and 10 parts water) or a household disinfectant with a label that says EPA-registered for coronaviruses. To make a bleach solution at home, add 1 tablespoon of  bleach to 1 quart (4 cups) of water. For a larger supply, add  cup of bleach to 1 gallon (16 cups) of water. Read labels of cleaning products and follow recommendations provided on product labels. Labels contain instructions for safe and effective use of the cleaning product including precautions you should take when applying the product, such as wearing gloves or eye protection and making sure you have good ventilation during use of the product. Remove gloves and wash hands immediately after cleaning.  Monitor yourself for signs and symptoms of illness Caregivers and household members are considered close contacts, should monitor their health, and will be asked to limit movement outside of the home to the extent possible. Follow the monitoring steps for close contacts listed on the symptom monitoring form.   ? If you have additional questions, contact your local health department or call the epidemiologist on call at [phone] (available 24/7). ? This guidance is subject to change. For the most up-to-date guidance from [facility], please refer to their website: [email or web address]

## 2020-07-10 NOTE — ED Notes (Signed)
Pt tolerating fluid.  

## 2020-07-10 NOTE — ED Provider Notes (Signed)
[facility] EMERGENCY DEPARTMENT Provider Note   CSN: [id number] Arrival date & time: 07/09/20  1827     History Chief Complaint  Patient presents with  . Headache    Ebony Wilson is a 19 y.o. female.  The history is provided by the patient and medical records. No language interpreter was used.  URI Presenting symptoms: congestion, cough, fatigue, fever and rhinorrhea   Severity:  Moderate Onset quality:  Gradual Duration:  1 week Progression:  Unchanged Chronicity:  New Relieved by:  Nothing Worsened by:  Nothing Associated symptoms: headaches   Associated symptoms: no neck pain and no wheezing   Risk factors: sick contacts        Past Medical History:  Diagnosis Date  . Headache    at her period   . Hypertension    as a child (had to diet to get bp down, no longer an issue)  . PTSD (post-traumatic stress disorder) 07/18/2016    Patient Active Problem List   Diagnosis Date Noted  . Bacterial vaginosis 09/20/2018  . PTSD (post-traumatic stress disorder) 07/18/2016  . MDD (major depressive disorder), recurrent episode, severe (HCC) 07/17/2016    Past Surgical History:  Procedure Laterality Date  . CHOLECYSTECTOMY N/A 02/08/2018   Procedure: LAPAROSCOPIC CHOLECYSTECTOMY;  Surgeon: Axel Filler, MD;  Location: [facility] OR;  Service: General;  Laterality: N/A;  . TONSILECTOMY, ADENOIDECTOMY, BILATERAL MYRINGOTOMY AND TUBES    . TONSILLECTOMY     age 60  . WISDOM TOOTH EXTRACTION       OB History   No obstetric history on file.     Family History  Problem Relation Age of Onset  . Other Mother        has a hole in her heart    Social History   Tobacco Use  . Smoking status: Never Smoker  . Smokeless tobacco: Never Used  Vaping Use  . Vaping Use: Never used  Substance Use Topics  . Alcohol use: No  . Drug use: No    Home Medications Prior to Admission medications   Medication Sig Start Date End Date Taking? Authorizing  Provider  acetaminophen (TYLENOL) 325 MG tablet Take 2 tablets (650 mg total) by mouth every 6 (six) hours as needed. 01/13/19   Lamptey, Britta Mccreedy, MD  albuterol (VENTOLIN HFA) 108 (90 Base) MCG/ACT inhaler Inhale 1-2 puffs into the lungs every 6 (six) hours as needed for wheezing or shortness of breath. 12/26/19   Dahlia Byes A, NP  cetirizine (ZYRTEC) 10 MG tablet Take 1 tablet (10 mg total) by mouth daily. 12/26/19   Bast, Gloris Manchester A, NP  fluticasone (FLONASE) 50 MCG/ACT nasal spray Place 1 spray into both nostrils daily. 12/26/19   Bast, Gloris Manchester A, NP  montelukast (SINGULAIR) 10 MG tablet Take 1 tablet (10 mg total) by mouth at bedtime. 12/26/19   Dahlia Byes A, NP  famotidine (PEPCID) 20 MG tablet Take 1 tablet (20 mg total) by mouth 2 (two) times daily. 11/16/18 12/26/19  Domenick Gong, MD  loratadine (CLARITIN) 10 MG tablet Take 1 tablet (10 mg total) by mouth daily. One po daily x 5 days 01/16/19 12/26/19  Arby Barrette, MD    Allergies    Watermelon [citrullus vulgaris]  Review of Systems   Review of Systems  Constitutional: Positive for chills, fatigue and fever. Negative for diaphoresis.  HENT: Positive for congestion and rhinorrhea.   Eyes: Positive for photophobia. Negative for visual disturbance.  Respiratory: Positive for cough. Negative for  chest tightness, shortness of breath and wheezing.   Cardiovascular: Negative for chest pain, palpitations and leg swelling.  Gastrointestinal: Positive for diarrhea, nausea and vomiting. Negative for abdominal pain and constipation.  Genitourinary: Negative for dysuria, flank pain and frequency.  Musculoskeletal: Negative for back pain and neck pain.  Skin: Negative for rash and wound.  Neurological: Positive for headaches. Negative for dizziness, seizures, facial asymmetry, weakness and light-headedness.  Psychiatric/Behavioral: Negative for agitation and confusion.  All other systems reviewed and are negative.   Physical Exam Updated Vital  Signs BP 107/64   Pulse 95   Temp (!) 101.4 F (38.6 C) (Oral)   Resp 16   Ht 5\' 5"  (1.651 m)   Wt 136.1 kg   SpO2 98%   BMI 49.92 kg/m   Physical Exam Vitals and nursing note reviewed.  Constitutional:      General: She is not in acute distress.    Appearance: She is well-developed. She is not ill-appearing, toxic-appearing or diaphoretic.  HENT:     Head: Normocephalic and atraumatic.     Nose: Congestion and rhinorrhea present.     Mouth/Throat:     Mouth: Mucous membranes are moist.     Pharynx: No oropharyngeal exudate or posterior oropharyngeal erythema.  Eyes:     Conjunctiva/sclera: Conjunctivae normal.     Pupils: Pupils are equal, round, and reactive to light.  Cardiovascular:     Rate and Rhythm: Normal rate and regular rhythm.     Heart sounds: Normal heart sounds. No murmur heard.   Pulmonary:     Effort: Pulmonary effort is normal. No respiratory distress.     Breath sounds: Normal breath sounds. No stridor. No wheezing, rhonchi or rales.  Chest:     Chest wall: No tenderness.  Abdominal:     General: There is no distension.     Palpations: Abdomen is soft.     Tenderness: There is no abdominal tenderness. There is no right CVA tenderness, left CVA tenderness, guarding or rebound.  Musculoskeletal:        General: No tenderness.     Cervical back: Neck supple. No rigidity.     Right lower leg: No edema.     Left lower leg: No edema.  Skin:    General: Skin is warm and dry.     Capillary Refill: Capillary refill takes less than 2 seconds.     Findings: No erythema.  Neurological:     General: No focal deficit present.     Mental Status: She is alert and oriented to person, place, and time.     Sensory: No sensory deficit.     Motor: No weakness.  Psychiatric:        Mood and Affect: Mood normal.     ED Results / Procedures / Treatments   Labs (all labs ordered are listed, but only abnormal results are displayed) Labs Reviewed  SARS  CORONAVIRUS 2 BY RT PCR (HOSPITAL ORDER, PERFORMED IN [place] [facility]) - Abnormal; Notable for the following components:      Result Value   SARS Coronavirus 2 POSITIVE (*)    All other components within normal limits    EKG None  Radiology No results found.  Procedures Procedures (including critical care time)  Medications Ordered in ED Medications  acetaminophen (TYLENOL) tablet 650 mg (650 mg Oral Given 07/10/20 0212)  ondansetron (ZOFRAN) tablet 4 mg (4 mg Oral Given 07/10/20 0813)  ondansetron (ZOFRAN-ODT) disintegrating tablet 4 mg (4  mg Oral Given 07/10/20 0912)   Ebony Wilson was evaluated in Emergency Department on 07/10/2020 for the symptoms described in the history of present illness. She was evaluated in the context of the global COVID-19 pandemic, which necessitated consideration that the patient might be at risk for infection with the SARS-CoV-2 virus that causes COVID-19. Institutional protocols and algorithms that pertain to the evaluation of patients at risk for COVID-19 are in a state of rapid change based on information released by regulatory bodies including the CDC and federal and state organizations. These policies and algorithms were followed during the patient's care in the ED.   ED Course  I have reviewed the triage vital signs and the nursing notes.  Pertinent labs & imaging results that were available during my care of the patient were reviewed by me and considered in my medical decision making (see chart for details).    MDM Rules/Calculators/A&P                          Ebony Wilson is a 19 y.o. female with a past medical history significant for hypertension as a child and recurrent headaches who presents with fevers, chills, cough, malaise, and headache.  Patient reports that she is been feeling bad for about a week.  She reports he was having fevers and tried taking Motrin at home but had some nausea and vomiting and could not keep it down.  She  says that she has been around some sick people and one of the friends has a Covid test currently in process but did not have the results back yet.  She says that she has not had any chest pain, palpitations, or true shortness of breath.  She reports she has had some diarrhea, nausea, and vomiting with it.  She has not been vaccinated and does not want to be vaccinated she reports.  She denies any rash, trauma, or other complaints.  She denies any pain from her head down.  On exam, lungs are clear.  She is having 100% oxygen saturations on room air.  Abdomen and chest are nontender.  Back is nontender.  No leg tenderness or leg swelling.  Good pulses in extremities.  Patient resting comfortably.  Patient did have some audible congestion and rhinorrhea seen.  Exam otherwise unremarkable.  Patient had a coronavirus test in triage that was positive.  Suspect COVID-19 is the cause of her constellation of symptoms.  As her lungs are clear and oxygen saturations are 100% on room air, we agreed to hold on any other labs or imaging at this time.  Due to her nausea and vomiting, we agreed to get her a dose of Zofran and a prescription for Zofran.  If she passes a p.o. challenge and is still feeling well, anticipate discharge home with instructions to follow-up with a PCP and self isolate for home treatment of COVID-19.  If symptoms worsen, she knows to return for further management.  Patient did have some vomiting with the oral Zofran and we gave her the ODT instead.  She was able to tolerate some fluids after.  We feel she is now safe for discharge home and she does not want to consider rectal Phenergan.  She understands plan of care and will be discharged.   Final Clinical Impression(s) / ED Diagnoses Final diagnoses:  COVID-19  Cough  Non-intractable vomiting with nausea, unspecified vomiting type  Diarrhea, unspecified type    Rx / DC Orders  ED Discharge Orders         Ordered    ondansetron (ZOFRAN  ODT) 4 MG disintegrating tablet  Every 8 hours PRN     Discontinue  Reprint     07/10/20 1043          Clinical Impression: 1. COVID-19   2. Cough   3. Non-intractable vomiting with nausea, unspecified vomiting type   4. Diarrhea, unspecified type     Disposition: Discharge  Condition: Good  I have discussed the results, Dx and Tx plan with the pt(& family if present). He/she/they expressed understanding and agree(s) with the plan. Discharge instructions discussed at great length. Strict return precautions discussed and pt &/or family have verbalized understanding of the instructions. No further questions at time of discharge.    New Prescriptions   No medications on file    Follow Up: [facility] [address] [place] [address] [phone]     [facility] EMERGENCY DEPARTMENT [address] [id number] mc [place] [address] [phone]       Ebony Wilson, Canary Brim, MD 07/10/20 1044

## 2020-07-10 NOTE — ED Notes (Signed)
Pt given more sprite.

## 2020-08-07 DIAGNOSIS — Z419 Encounter for procedure for purposes other than remedying health state, unspecified: Secondary | ICD-10-CM | POA: Diagnosis not present

## 2020-09-06 DIAGNOSIS — Z419 Encounter for procedure for purposes other than remedying health state, unspecified: Secondary | ICD-10-CM | POA: Diagnosis not present

## 2020-10-07 DIAGNOSIS — Z419 Encounter for procedure for purposes other than remedying health state, unspecified: Secondary | ICD-10-CM | POA: Diagnosis not present

## 2020-10-27 ENCOUNTER — Ambulatory Visit (INDEPENDENT_AMBULATORY_CARE_PROVIDER_SITE_OTHER): Payer: Medicaid Other

## 2020-10-27 ENCOUNTER — Ambulatory Visit (HOSPITAL_COMMUNITY)
Admission: EM | Admit: 2020-10-27 | Discharge: 2020-10-27 | Disposition: A | Discharge status: Home / Self Care | Payer: Medicaid Other | Attending: Urgent Care | Admitting: Urgent Care

## 2020-10-27 ENCOUNTER — Encounter (HOSPITAL_COMMUNITY): Payer: Self-pay

## 2020-10-27 ENCOUNTER — Other Ambulatory Visit: Payer: Self-pay

## 2020-10-27 DIAGNOSIS — S60221A Contusion of right hand, initial encounter: Secondary | ICD-10-CM

## 2020-10-27 DIAGNOSIS — M79641 Pain in right hand: Secondary | ICD-10-CM | POA: Diagnosis not present

## 2020-10-27 DIAGNOSIS — M25531 Pain in right wrist: Secondary | ICD-10-CM

## 2020-10-27 DIAGNOSIS — S60211A Contusion of right wrist, initial encounter: Secondary | ICD-10-CM

## 2020-10-27 DIAGNOSIS — S6991XA Unspecified injury of right wrist, hand and finger(s), initial encounter: Secondary | ICD-10-CM | POA: Diagnosis not present

## 2020-10-27 MED ORDER — NAPROXEN 500 MG PO TABS: 500.0000 mg | ORAL_TABLET | Freq: Two times a day (BID) | ORAL | 0 refills | Status: DC

## 2020-10-27 NOTE — ED Triage Notes (Signed)
Pt c/o right wrist and hand pain after a box fell on her wrist/hand while at work on Thursday and her hand was trapped in between box and ground.  Edema noted to right wrist, pain to posterior wrist and lateral hand area, difficulty making fist. Pt applied ice after injury. No OTC meds taken for pain/inflammation.  +2 radial pulse, brisk cap refill. Requests refill of allergy medications and inhaler.

## 2020-10-27 NOTE — ED Provider Notes (Signed)
[facility]   MRN: [id number] DOB: 09-17-2001  Subjective:   Ebony Wilson is a 19 y.o. female presenting for persistent right wrist and hand pain with swelling.  Patient was at work, one of her coworkers accidentally slammed the box up against her right wrist and hand.  She has since had significant difficulty with her pain and swelling.  She would like to make sure she does not have a fracture.  Has not been using any medications for relief.  She continues to work and she does not want to miss any hours and lose pay.  No current facility-administered medications for this encounter.  Current Outpatient Medications:  .  acetaminophen (TYLENOL) 325 MG tablet, Take 2 tablets (650 mg total) by mouth every 6 (six) hours as needed., Disp: , Rfl:  .  albuterol (VENTOLIN HFA) 108 (90 Base) MCG/ACT inhaler, Inhale 1-2 puffs into the lungs every 6 (six) hours as needed for wheezing or shortness of breath., Disp: 18 g, Rfl: 0 .  cetirizine (ZYRTEC) 10 MG tablet, Take 1 tablet (10 mg total) by mouth daily., Disp: 30 tablet, Rfl: 0 .  fluticasone (FLONASE) 50 MCG/ACT nasal spray, Place 1 spray into both nostrils daily., Disp: 16 g, Rfl: 2 .  montelukast (SINGULAIR) 10 MG tablet, Take 1 tablet (10 mg total) by mouth at bedtime., Disp: 30 tablet, Rfl: 1 .  ondansetron (ZOFRAN ODT) 4 MG disintegrating tablet, Take 1 tablet (4 mg total) by mouth every 8 (eight) hours as needed for nausea or vomiting., Disp: 14 tablet, Rfl: 0   Allergies  Allergen Reactions  . Watermelon [Citrullus Vulgaris] Itching and Swelling    Makes lips swell and throat itch!! NO MELONS!!  . Pineapple Swelling    Past Medical History:  Diagnosis Date  . COVID-19 07/09/2020  . Headache    at her period   . Hypertension    as a child (had to diet to get bp down, no longer an issue)  . PTSD (post-traumatic stress disorder) 07/18/2016     Past Surgical History:  Procedure Laterality Date  . CHOLECYSTECTOMY  N/A 02/08/2018   Procedure: LAPAROSCOPIC CHOLECYSTECTOMY;  Surgeon: Axel Filler, MD;  Location: [facility] OR;  Service: General;  Laterality: N/A;  . TONSILECTOMY, ADENOIDECTOMY, BILATERAL MYRINGOTOMY AND TUBES    . TONSILLECTOMY     age 1  . WISDOM TOOTH EXTRACTION      Family History  Problem Relation Age of Onset  . Other Mother        has a hole in her heart    Social History   Tobacco Use  . Smoking status: Never Smoker  . Smokeless tobacco: Never Used  Vaping Use  . Vaping Use: Never used  Substance Use Topics  . Alcohol use: No  . Drug use: No    ROS   Objective:   Vitals: BP (!) 138/94 (BP Location: Right Arm)   Pulse 81   Temp 98.3 F (36.8 C) (Oral)   Resp 18   LMP 06/06/2020 (Approximate)   SpO2 98%   Physical Exam Constitutional:      General: She is not in acute distress.    Appearance: Normal appearance. She is well-developed. She is obese. She is not ill-appearing, toxic-appearing or diaphoretic.  HENT:     Head: Normocephalic and atraumatic.     Nose: Nose normal.     Mouth/Throat:     Mouth: Mucous membranes are moist.     Pharynx: Oropharynx is  clear.  Eyes:     General: No scleral icterus.       Right eye: No discharge.        Left eye: No discharge.     Extraocular Movements: Extraocular movements intact.     Conjunctiva/sclera: Conjunctivae normal.     Pupils: Pupils are equal, round, and reactive to light.  Cardiovascular:     Rate and Rhythm: Normal rate.  Pulmonary:     Effort: Pulmonary effort is normal.  Musculoskeletal:     Right wrist: Swelling, tenderness and bony tenderness present. No deformity, effusion, lacerations, snuff box tenderness or crepitus. Decreased range of motion.     Right hand: Swelling, tenderness and bony tenderness present. No deformity or lacerations. Decreased range of motion. Normal strength. Normal sensation. Normal capillary refill.       Hands:  Skin:    General: Skin is warm and dry.    Neurological:     General: No focal deficit present.     Mental Status: She is alert and oriented to person, place, and time.  Psychiatric:        Mood and Affect: Mood normal.        Behavior: Behavior normal.        Thought Content: Thought content normal.        Judgment: Judgment normal.     DG Wrist Complete Right  Result Date: 10/27/2020 CLINICAL DATA:  Right wrist and hand pain.  Injury. EXAM: RIGHT WRIST - COMPLETE 3+ VIEW COMPARISON:  None. FINDINGS: There is no evidence of fracture or dislocation. There is no evidence of arthropathy or other focal bone abnormality. Soft tissues are unremarkable. IMPRESSION: Negative. Electronically Signed   By: Gerome Sam III M.D   On: 10/27/2020 14:28     Assessment and Plan :   PDMP not reviewed this encounter.  1. Right wrist pain   2. Hand pain, right   3. Contusion of right wrist, initial encounter   4. Contusion of right hand, initial encounter     X-ray was negative, will manage for contusion with Ace wrap, rest and naproxen.  I have wrapped her right wrist and hand 3 inch Ace wrap. Counseled patient on potential for adverse effects with medications prescribed/recommended today, ER and return-to-clinic precautions discussed, patient verbalized understanding.    [name], PA-C 10/27/20 1452

## 2020-11-06 DIAGNOSIS — Z419 Encounter for procedure for purposes other than remedying health state, unspecified: Secondary | ICD-10-CM | POA: Diagnosis not present

## 2020-12-07 DIAGNOSIS — Z419 Encounter for procedure for purposes other than remedying health state, unspecified: Secondary | ICD-10-CM | POA: Diagnosis not present

## 2021-01-07 DIAGNOSIS — Z419 Encounter for procedure for purposes other than remedying health state, unspecified: Secondary | ICD-10-CM | POA: Diagnosis not present

## 2021-02-04 DIAGNOSIS — Z419 Encounter for procedure for purposes other than remedying health state, unspecified: Secondary | ICD-10-CM | POA: Diagnosis not present

## 2021-03-07 DIAGNOSIS — Z419 Encounter for procedure for purposes other than remedying health state, unspecified: Secondary | ICD-10-CM | POA: Diagnosis not present

## 2021-04-05 ENCOUNTER — Other Ambulatory Visit: Payer: Self-pay

## 2021-04-05 ENCOUNTER — Ambulatory Visit (HOSPITAL_COMMUNITY)
Admission: EM | Admit: 2021-04-05 | Discharge: 2021-04-05 | Disposition: A | Discharge status: Home / Self Care | Payer: Medicaid Other | Attending: Emergency Medicine | Admitting: Emergency Medicine

## 2021-04-05 ENCOUNTER — Encounter (HOSPITAL_COMMUNITY): Payer: Self-pay

## 2021-04-05 DIAGNOSIS — R197 Diarrhea, unspecified: Secondary | ICD-10-CM | POA: Diagnosis not present

## 2021-04-05 DIAGNOSIS — R112 Nausea with vomiting, unspecified: Secondary | ICD-10-CM | POA: Diagnosis not present

## 2021-04-05 LAB — POCT URINALYSIS DIPSTICK, ED / UC
Bilirubin Urine: NEGATIVE
Glucose, UA: NEGATIVE mg/dL
Hgb urine dipstick: NEGATIVE
Ketones, ur: NEGATIVE mg/dL
Nitrite: NEGATIVE
Protein, ur: NEGATIVE mg/dL
Specific Gravity, Urine: 1.02 (ref 1.005–1.030)
Urobilinogen, UA: 1 mg/dL (ref 0.0–1.0)
pH: 7 (ref 5.0–8.0)

## 2021-04-05 LAB — HCG, QUANTITATIVE, PREGNANCY: hCG, Beta Chain, Quant, S: 1 m[IU]/mL (ref <5)

## 2021-04-05 LAB — POC URINE PREG, ED: Preg Test, Ur: NEGATIVE

## 2021-04-05 MED ORDER — ONDANSETRON HCL 4 MG PO TABS: 4.0000 mg | ORAL_TABLET | Freq: Four times a day (QID) | ORAL | 0 refills | Status: DC

## 2021-04-05 NOTE — ED Provider Notes (Signed)
[facility]    CSN: [id number] Arrival date & time: 04/05/21  1641      History   Chief Complaint Chief Complaint  Patient presents with  . Abdominal Pain  . Emesis  . Possible Pregnancy    HPI Crissy Rawlinson is a 20 y.o. female.   HPI   Abdominal Pain: Patient reports that for the past week she has had some generalized abdominal pain as well as vomiting and a few episodes of diarrhea.  She is concerned that she is pregnant as she is a week late on her menstrual cycle.  She states that her vomiting episodes occur mainly with food and she has been able to keep down liquids for the most part.  Vomiting episodes appear like last meal.  She has not taken anything for symptoms.  She denies fevers, pelvic pain, dysuria.  Past Medical History:  Diagnosis Date  . COVID-19 07/09/2020  . Headache    at her period   . Hypertension    as a child (had to diet to get bp down, no longer an issue)  . PTSD (post-traumatic stress disorder) 07/18/2016    Patient Active Problem List   Diagnosis Date Noted  . Bacterial vaginosis 09/20/2018  . PTSD (post-traumatic stress disorder) 07/18/2016  . MDD (major depressive disorder), recurrent episode, severe (HCC) 07/17/2016    Past Surgical History:  Procedure Laterality Date  . CHOLECYSTECTOMY N/A 02/08/2018   Procedure: LAPAROSCOPIC CHOLECYSTECTOMY;  Surgeon: Axel Filler, MD;  Location: [facility] OR;  Service: General;  Laterality: N/A;  . TONSILECTOMY, ADENOIDECTOMY, BILATERAL MYRINGOTOMY AND TUBES    . TONSILLECTOMY     age 50  . WISDOM TOOTH EXTRACTION      OB History   No obstetric history on file.      Home Medications    Prior to Admission medications   Medication Sig Start Date End Date Taking? Authorizing Provider  acetaminophen (TYLENOL) 325 MG tablet Take 2 tablets (650 mg total) by mouth every 6 (six) hours as needed. 01/13/19   Lamptey, Britta Mccreedy, MD  albuterol (VENTOLIN HFA) 108 (90 Base) MCG/ACT inhaler Inhale  1-2 puffs into the lungs every 6 (six) hours as needed for wheezing or shortness of breath. 12/26/19   Dahlia Byes A, NP  cetirizine (ZYRTEC) 10 MG tablet Take 1 tablet (10 mg total) by mouth daily. 12/26/19   Bast, Gloris Manchester A, NP  fluticasone (FLONASE) 50 MCG/ACT nasal spray Place 1 spray into both nostrils daily. 12/26/19   Bast, Gloris Manchester A, NP  montelukast (SINGULAIR) 10 MG tablet Take 1 tablet (10 mg total) by mouth at bedtime. 12/26/19   Dahlia Byes A, NP  naproxen (NAPROSYN) 500 MG tablet Take 1 tablet (500 mg total) by mouth 2 (two) times daily with a meal. 10/27/20   [name], PA-C  ondansetron (ZOFRAN ODT) 4 MG disintegrating tablet Take 1 tablet (4 mg total) by mouth every 8 (eight) hours as needed for nausea or vomiting. 07/10/20   Tegeler, Canary Brim, MD  famotidine (PEPCID) 20 MG tablet Take 1 tablet (20 mg total) by mouth 2 (two) times daily. 11/16/18 12/26/19  Domenick Gong, MD  loratadine (CLARITIN) 10 MG tablet Take 1 tablet (10 mg total) by mouth daily. One po daily x 5 days 01/16/19 12/26/19  Arby Barrette, MD    Family History Family History  Problem Relation Age of Onset  . Other Mother        has a hole in her heart  Social History Social History   Tobacco Use  . Smoking status: Never Smoker  . Smokeless tobacco: Never Used  Vaping Use  . Vaping Use: Never used  Substance Use Topics  . Alcohol use: No  . Drug use: No     Allergies   Watermelon [citrullus vulgaris] and Pineapple   Review of Systems Review of Systems  As stated above in HPI Physical Exam Triage Vital Signs ED Triage Vitals  Enc Vitals Group     BP 04/05/21 1715 133/79     Pulse Rate 04/05/21 1715 (!) 110     Resp 04/05/21 1715 18     Temp 04/05/21 1715 98.2 F (36.8 C)     Temp Source 04/05/21 1715 Oral     SpO2 04/05/21 1715 100 %     Weight --      Height --      Head Circumference --      Peak Flow --      Pain Score 04/05/21 1717 8     Pain Loc --      Pain Edu? --       Excl. in GC? --    No data found.  Updated Vital Signs BP 133/79 (BP Location: Right Arm)   Pulse (!) 110   Temp 98.2 F (36.8 C) (Oral)   Resp 18   LMP 03/01/2021   SpO2 100%   Physical Exam Vitals and nursing note reviewed.  Constitutional:      General: She is not in acute distress.    Appearance: She is well-developed. She is obese. She is not ill-appearing, toxic-appearing or diaphoretic.  HENT:     Mouth/Throat:     Mouth: Mucous membranes are moist.     Pharynx: Oropharynx is clear. No oropharyngeal exudate.  Cardiovascular:     Rate and Rhythm: Normal rate and regular rhythm.     Heart sounds: Normal heart sounds.  Pulmonary:     Effort: Pulmonary effort is normal.     Breath sounds: Normal breath sounds.  Abdominal:     General: Abdomen is flat. Bowel sounds are normal. There is no distension.     Palpations: Abdomen is soft. There is no shifting dullness, fluid wave, hepatomegaly, splenomegaly, mass or pulsatile mass.     Tenderness: There is no abdominal tenderness.     Hernia: No hernia is present.  Skin:    General: Skin is warm.     Coloration: Skin is not jaundiced or pale.  Neurological:     Mental Status: She is alert and oriented to person, place, and time.      UC Treatments / Results  Labs (all labs ordered are listed, but only abnormal results are displayed) Labs Reviewed  POCT URINALYSIS DIPSTICK, ED / UC - Abnormal; Notable for the following components:      Result Value   Leukocytes,Ua TRACE (*)    All other components within normal limits  POC URINE PREG, ED    EKG   Radiology No results found.  Procedures Procedures (including critical care time)  Medications Ordered in UC Medications - No data to display  Initial Impression / Assessment and Plan / UC Course  I have reviewed the triage vital signs and the nursing notes.  Pertinent labs & imaging results that were available during my care of the patient were reviewed by me  and considered in my medical decision making (see chart for details).     New.  Likely viral  gastroenteritis which I discussed with patient.  Patient request hCG blood work as she reports after our discussion that she had a home positive test on Tuesday of this week.  She will [facility] for repeat and comparative testing as we discussed this would be needed and we discussed red flag signs symptoms.  Also sending in Zofran for her symptoms as her urine HCG today in office is negative.  Final Clinical Impressions(s) / UC Diagnoses   Final diagnoses:  None   Discharge Instructions   None    ED Prescriptions    None     PDMP not reviewed this encounter.   Rushie Chestnut, [place] 04/05/21 1814

## 2021-04-05 NOTE — ED Triage Notes (Signed)
Pt present abdominal pain wit vomiting and a week late for period to come on. Pt state  She would like a covid, blood work to check on body and pregnancy test

## 2021-04-06 DIAGNOSIS — Z419 Encounter for procedure for purposes other than remedying health state, unspecified: Secondary | ICD-10-CM | POA: Diagnosis not present

## 2021-04-22 ENCOUNTER — Inpatient Hospital Stay (HOSPITAL_COMMUNITY)
Admission: AD | Admit: 2021-04-22 | Discharge: 2021-04-22 | Disposition: A | Discharge status: Home / Self Care | Payer: Medicaid Other | Attending: Obstetrics & Gynecology | Admitting: Obstetrics & Gynecology

## 2021-04-22 ENCOUNTER — Other Ambulatory Visit: Payer: Self-pay

## 2021-04-22 DIAGNOSIS — Z3202 Encounter for pregnancy test, result negative: Secondary | ICD-10-CM | POA: Diagnosis not present

## 2021-04-22 LAB — POCT PREGNANCY, URINE: Preg Test, Ur: NEGATIVE

## 2021-04-22 NOTE — MAU Note (Signed)
Presents with c/o abdominal cramping and VB that began 3 days ago.  Reports VB was iniitally light pink, now dark red and heavier than a menstrual cycle.  States +HPT x3 (April 18, 25, and 28).  LMP 03/01/2021.

## 2021-04-22 NOTE — MAU Provider Note (Signed)
Event Date/Time   First Provider Initiated Contact with Patient 04/22/21 1714      S Ms. Ebony Wilson is a 20 y.o. No obstetric history on file. patient who presents to MAU today with complaint of wanting to know if she had a miscarriage. Patient reports positive home UPT on April 18, 25 and 28. Patient then went to the ED on 04/05/2021 and had an hCG of 1. UPT in MAU negative today.  O BP 136/88 (BP Location: Right Arm)   Pulse (!) 112   Temp 98.1 F (36.7 C) (Oral)   Resp 20   Ht 5' 5.5" (1.664 m)   Wt (!) 146.4 kg   LMP 03/01/2021   SpO2 99%   BMI 52.88 kg/m    Patient Vitals for the past 24 hrs:  BP Temp Temp src Pulse Resp SpO2 Height Weight  04/22/21 1704 136/88 98.1 F (36.7 C) Oral (!) 112 20 99 % -- --  04/22/21 1656 -- -- -- -- -- -- 5' 5.5" (1.664 m) (!) 146.4 kg   Physical Exam Vitals and nursing note reviewed.  Constitutional:      General: She is not in acute distress.    Appearance: Normal appearance. She is not ill-appearing, toxic-appearing or diaphoretic.  Pulmonary:     Effort: Pulmonary effort is normal.  Neurological:     Mental Status: She is alert and oriented to person, place, and time.  Psychiatric:        Mood and Affect: Mood normal.        Behavior: Behavior normal.        Thought Content: Thought content normal.        Judgment: Judgment normal.    A Medical screening exam complete UPT negative  P Discharge from MAU in stable condition Patient given the option of transfer to [facility] for further evaluation or seek care in outpatient facility of choice Pt advised if desiring pregnancy to start PNVs, if not desiring pregnancy to f/u with OB/GYN to discuss birth control List of options for follow-up given in MyChart Warning signs for worsening condition that would warrant emergency follow-up discussed Patient may return to MAU as needed   Hermina Barnard, Odie Sera, NP 04/22/2021 5:34 PM

## 2021-04-22 NOTE — Discharge Instructions (Signed)
Prenatal Care Providers           [facility] - accepts patients without insurance  Phone: [phone]  [facility] @ [facility]   Phone: [phone]  [facility]       Phone: [phone]            [facility] @ [place]     Phone: [phone]          [facility] @ [place]   Phone: [phone]  [facility] @ [facility] - accepts patients without insurance  Phone: [phone]  [facility] @ [facility] Phone: [phone]     [facility] - accepts patients without insurance Phone: [phone]  [facility]  Phone: [phone]  [facility] Phone: [phone]  [facility] Phone: [phone]  [facility] Phone: [phone]  [facility] Phone: [phone]  [facility]  Phone: [phone]         

## 2021-05-07 DIAGNOSIS — Z419 Encounter for procedure for purposes other than remedying health state, unspecified: Secondary | ICD-10-CM | POA: Diagnosis not present

## 2021-06-06 DIAGNOSIS — Z419 Encounter for procedure for purposes other than remedying health state, unspecified: Secondary | ICD-10-CM | POA: Diagnosis not present

## 2021-07-07 DIAGNOSIS — Z419 Encounter for procedure for purposes other than remedying health state, unspecified: Secondary | ICD-10-CM | POA: Diagnosis not present

## 2021-08-07 DIAGNOSIS — Z419 Encounter for procedure for purposes other than remedying health state, unspecified: Secondary | ICD-10-CM | POA: Diagnosis not present

## 2021-09-02 ENCOUNTER — Encounter (HOSPITAL_COMMUNITY): Payer: Self-pay | Admitting: Emergency Medicine

## 2021-09-02 ENCOUNTER — Other Ambulatory Visit: Payer: Self-pay

## 2021-09-02 ENCOUNTER — Ambulatory Visit (HOSPITAL_COMMUNITY)
Admission: EM | Admit: 2021-09-02 | Discharge: 2021-09-02 | Disposition: A | Discharge status: Home / Self Care | Payer: Medicaid Other | Attending: Student | Admitting: Student

## 2021-09-02 DIAGNOSIS — J4520 Mild intermittent asthma, uncomplicated: Secondary | ICD-10-CM

## 2021-09-02 DIAGNOSIS — Z760 Encounter for issue of repeat prescription: Secondary | ICD-10-CM

## 2021-09-02 DIAGNOSIS — Z131 Encounter for screening for diabetes mellitus: Secondary | ICD-10-CM | POA: Diagnosis not present

## 2021-09-02 DIAGNOSIS — L03221 Cellulitis of neck: Secondary | ICD-10-CM | POA: Diagnosis not present

## 2021-09-02 DIAGNOSIS — J301 Allergic rhinitis due to pollen: Secondary | ICD-10-CM | POA: Diagnosis not present

## 2021-09-02 LAB — CBG MONITORING, ED: Glucose-Capillary: 77 mg/dL (ref 70–99)

## 2021-09-02 MED ORDER — FLUTICASONE PROPIONATE 50 MCG/ACT NA SUSP: 1.0000 | Freq: Every day | NASAL | 2 refills | Status: DC

## 2021-09-02 MED ORDER — ALBUTEROL SULFATE HFA 108 (90 BASE) MCG/ACT IN AERS
1.0000 | INHALATION_SPRAY | Freq: Four times a day (QID) | RESPIRATORY_TRACT | 0 refills | Status: DC | PRN
Start: 2021-09-02 — End: 2021-09-12

## 2021-09-02 MED ORDER — NAPHAZOLINE-PHENIRAMINE 0.025-0.3 % OP SOLN: 1.0000 [drp] | Freq: Four times a day (QID) | OPHTHALMIC | 1 refills | Status: DC | PRN

## 2021-09-02 MED ORDER — DOXYCYCLINE HYCLATE 100 MG PO CAPS: 100.0000 mg | ORAL_CAPSULE | Freq: Two times a day (BID) | ORAL | 0 refills | Status: AC

## 2021-09-02 MED ORDER — CETIRIZINE HCL 10 MG PO TABS: 10.0000 mg | ORAL_TABLET | Freq: Every day | ORAL | 0 refills | Status: DC

## 2021-09-02 NOTE — ED Triage Notes (Signed)
Patient states she is unable to get a urine sample that she has not drank anything in the past 24hrs.

## 2021-09-02 NOTE — Discharge Instructions (Addendum)
-  Albuterol inhaler as needed for cough, wheezing, shortness of breath, 1 to 2 puffs every 6 hours as needed. -Doxycycline twice daily for 7 days.  Make sure to wear sunscreen while spending time outside while on this medication as it can increase your chance of sunburn. You can take this medication with food if you have a sensitive stomach. -Flonase nasal steroid: place 2 sprays into both nostrils in the morning and at bedtime for at least 7 days. Continue for longer if this is helping. -Naphcon drops for allergic conjunctivitis  -Zyrtec once daily

## 2021-09-02 NOTE — ED Triage Notes (Addendum)
Pt is present today with a knot on the back of her head.Pt states that she hit her head at work a few weeks ago and noticed a knot afterwards. Pt states that the knot has been getting bigger over time.   Pt also has concerns for diabetes. Pt states that she checked her BS at work and it was over 200. Pt states that she has had dry mouth, extreme thirst, and urine frequency.

## 2021-09-02 NOTE — ED Provider Notes (Signed)
[facility]    CSN: [id number] Arrival date & time: 09/02/21  1520      History   Chief Complaint Chief Complaint  Patient presents with   Mass    HPI Ebony Wilson is a 20 y.o. female presenting with swelling at the base of her skull/neck, following hitting her head at work a few weeks ago.  States that immediately after that she noticed a small knot, but this progressed into small amount of swelling and tenderness.  Denies discharge, fever/chills.  Also states she is concerned that she is a diabetic, she checked her nonfasting sugars at work 1 week ago and this was about 200.  Also notes urinary frequency, thirst, dry mouth.  Denies urinary symptoms.  She has not eaten or drinking anything in about 24 hours.  Feeling well otherwise. Requesting refill on albuterol inhaler, allergy medication, eyedrops.  HPI  Past Medical History:  Diagnosis Date   COVID-19 07/09/2020   Headache    at her period    Hypertension    as a child (had to diet to get bp down, no longer an issue)   PTSD (post-traumatic stress disorder) 07/18/2016    Patient Active Problem List   Diagnosis Date Noted   Bacterial vaginosis 09/20/2018   PTSD (post-traumatic stress disorder) 07/18/2016   MDD (major depressive disorder), recurrent episode, severe (HCC) 07/17/2016    Past Surgical History:  Procedure Laterality Date   CHOLECYSTECTOMY N/A 02/08/2018   Procedure: LAPAROSCOPIC CHOLECYSTECTOMY;  Surgeon: Axel Filler, MD;  Location: [facility] OR;  Service: General;  Laterality: N/A;   TONSILECTOMY, ADENOIDECTOMY, BILATERAL MYRINGOTOMY AND TUBES     TONSILLECTOMY     age 32   WISDOM TOOTH EXTRACTION      OB History   No obstetric history on file.      Home Medications    Prior to Admission medications   Medication Sig Start Date End Date Taking? Authorizing Provider  doxycycline (VIBRAMYCIN) 100 MG capsule Take 1 capsule (100 mg total) by mouth 2 (two) times daily for 7 days. 09/02/21  09/09/21 Yes Rhys Martini, PA-C  naphazoline-pheniramine (NAPHCON-A) 0.025-0.3 % ophthalmic solution Place 1 drop into both eyes every 6 (six) hours as needed for eye irritation. 09/02/21  Yes Rhys Martini, PA-C  acetaminophen (TYLENOL) 325 MG tablet Take 2 tablets (650 mg total) by mouth every 6 (six) hours as needed. 01/13/19   Lamptey, Britta Mccreedy, MD  albuterol (VENTOLIN HFA) 108 (90 Base) MCG/ACT inhaler Inhale 1-2 puffs into the lungs every 6 (six) hours as needed for wheezing or shortness of breath. 09/02/21   Rhys Martini, PA-C  cetirizine (ZYRTEC) 10 MG tablet Take 1 tablet (10 mg total) by mouth daily. 09/02/21   Rhys Martini, PA-C  fluticasone (FLONASE) 50 MCG/ACT nasal spray Place 1 spray into both nostrils daily. 09/02/21   Rhys Martini, PA-C  montelukast (SINGULAIR) 10 MG tablet Take 1 tablet (10 mg total) by mouth at bedtime. 12/26/19   Dahlia Byes A, NP  famotidine (PEPCID) 20 MG tablet Take 1 tablet (20 mg total) by mouth 2 (two) times daily. 11/16/18 12/26/19  Domenick Gong, MD  loratadine (CLARITIN) 10 MG tablet Take 1 tablet (10 mg total) by mouth daily. One po daily x 5 days 01/16/19 12/26/19  Arby Barrette, MD    Family History Family History  Problem Relation Age of Onset   Other Mother        has a hole in her heart  Social History Social History   Tobacco Use   Smoking status: Never   Smokeless tobacco: Never  Vaping Use   Vaping Use: Never used  Substance Use Topics   Alcohol use: No   Drug use: No     Allergies   Watermelon [citrullus vulgaris] and Pineapple   Review of Systems Review of Systems  Skin:  Positive for color change.    Physical Exam Triage Vital Signs ED Triage Vitals  Enc Vitals Group     BP 09/02/21 1648 118/76     Pulse Rate 09/02/21 1648 88     Resp 09/02/21 1648 18     Temp 09/02/21 1648 98.3 F (36.8 C)     Temp src --      SpO2 09/02/21 1648 98 %     Weight --      Height --      Head Circumference --       Peak Flow --      Pain Score 09/02/21 1647 10     Pain Loc --      Pain Edu? --      Excl. in GC? --    No data found.  Updated Vital Signs BP 118/76   Pulse 88   Temp 98.3 F (36.8 C)   Resp 18   SpO2 98%   Visual Acuity Right Eye Distance:   Left Eye Distance:   Bilateral Distance:    Right Eye Near:   Left Eye Near:    Bilateral Near:     Physical Exam Vitals reviewed.  Constitutional:      General: She is not in acute distress.    Appearance: Normal appearance. She is obese. She is not ill-appearing.  HENT:     Head: Normocephalic and atraumatic.     Right Ear: Tympanic membrane, ear canal and external ear normal. No tenderness. No middle ear effusion. There is no impacted cerumen. Tympanic membrane is not perforated, erythematous, retracted or bulging.     Left Ear: Tympanic membrane, ear canal and external ear normal. No tenderness.  No middle ear effusion. There is no impacted cerumen. Tympanic membrane is not perforated, erythematous, retracted or bulging.     Nose: Nose normal. No congestion.     Mouth/Throat:     Mouth: Mucous membranes are moist.     Pharynx: Uvula midline. No oropharyngeal exudate or posterior oropharyngeal erythema.  Eyes:     Extraocular Movements: Extraocular movements intact.     Pupils: Pupils are equal, round, and reactive to light.  Cardiovascular:     Rate and Rhythm: Normal rate and regular rhythm.     Heart sounds: Normal heart sounds.  Pulmonary:     Effort: Pulmonary effort is normal.     Breath sounds: Normal breath sounds. No decreased breath sounds, wheezing, rhonchi or rales.     Comments: Decreased sounds throughout due to body habitus  Abdominal:     Palpations: Abdomen is soft.     Tenderness: There is no abdominal tenderness. There is no guarding or rebound.  Skin:    Comments: Base of skull/ posterior neck- 2cm well circumscribed cyst, mobile, tender, with overlying erythema and small scab. No discharge.    Neurological:     General: No focal deficit present.     Mental Status: She is alert and oriented to person, place, and time.  Psychiatric:        Mood and Affect: Mood normal.  Behavior: Behavior normal.        Thought Content: Thought content normal.        Judgment: Judgment normal.     UC Treatments / Results  Labs (all labs ordered are listed, but only abnormal results are displayed) Labs Reviewed  CBG MONITORING, ED  POCT URINALYSIS DIPSTICK, ED / UC    EKG   Radiology No results found.  Procedures Procedures (including critical care time)  Medications Ordered in UC Medications - No data to display  Initial Impression / Assessment and Plan / UC Course  I have reviewed the triage vital signs and the nursing notes.  Pertinent labs & imaging results that were available during my care of the patient were reviewed by me and considered in my medical decision making (see chart for details).     This patient is a very pleasant 20 y.o. year old female presenting with skin infection and diabetes screening.  She also incidentally requests multiple medication refills for her asthma, allergies. Today this pt is afebrile nontachycardic nontachypneic, oxygenating well on room air, no wheezes rhonchi or rales.   For skin infection, doxycycline as below.  For diabetes, she is requesting screening today; fasting sugars 70s today, but she does state that her home nonfasting sugars were 200s.  Recommended follow-up with PCP for this.  For asthma, no current exacerbation, but she is requesting albuterol inhaler, sent.  For allergies sent Zyrtec and Flonase and Naphcon drops. ED return precautions discussed. Patient verbalizes understanding and agreement.  Coding level 4 given 2 new problems, lab work, medication management, and refills for 2 chronic conditions.   Final Clinical Impressions(s) / UC Diagnoses   Final diagnoses:  Cellulitis of neck  Diabetes mellitus screening   Mild intermittent asthma without complication  Seasonal allergic rhinitis due to pollen  Medication refill     Discharge Instructions      -Albuterol inhaler as needed for cough, wheezing, shortness of breath, 1 to 2 puffs every 6 hours as needed. -Doxycycline twice daily for 7 days.  Make sure to wear sunscreen while spending time outside while on this medication as it can increase your chance of sunburn. You can take this medication with food if you have a sensitive stomach. -Flonase nasal steroid: place 2 sprays into both nostrils in the morning and at bedtime for at least 7 days. Continue for longer if this is helping. -Naphcon drops for allergic conjunctivitis  -Zyrtec once daily    ED Prescriptions     Medication Sig Dispense Auth. Provider   albuterol (VENTOLIN HFA) 108 (90 Base) MCG/ACT inhaler Inhale 1-2 puffs into the lungs every 6 (six) hours as needed for wheezing or shortness of breath. 18 g Rhys Martini, PA-C   cetirizine (ZYRTEC) 10 MG tablet Take 1 tablet (10 mg total) by mouth daily. 30 tablet Rhys Martini, PA-C   fluticasone [facility]) 50 MCG/ACT nasal spray Place 1 spray into both nostrils daily. 16 g Rhys Martini, PA-C   naphazoline-pheniramine (NAPHCON-A) 0.025-0.3 % ophthalmic solution Place 1 drop into both eyes every 6 (six) hours as needed for eye irritation. 15 mL Rhys Martini, PA-C   doxycycline (VIBRAMYCIN) 100 MG capsule Take 1 capsule (100 mg total) by mouth 2 (two) times daily for 7 days. 14 capsule Rhys Martini, PA-C      PDMP not reviewed this encounter.   Rhys Martini, PA-C 09/02/21 1830

## 2021-09-06 DIAGNOSIS — Z419 Encounter for procedure for purposes other than remedying health state, unspecified: Secondary | ICD-10-CM | POA: Diagnosis not present

## 2021-09-12 ENCOUNTER — Other Ambulatory Visit: Payer: Self-pay

## 2021-09-12 ENCOUNTER — Encounter (HOSPITAL_COMMUNITY): Payer: Self-pay | Admitting: *Deleted

## 2021-09-12 ENCOUNTER — Ambulatory Visit (HOSPITAL_COMMUNITY)
Admission: EM | Admit: 2021-09-12 | Discharge: 2021-09-12 | Disposition: A | Discharge status: Home / Self Care | Payer: Medicaid Other | Attending: Physician Assistant | Admitting: Physician Assistant

## 2021-09-12 DIAGNOSIS — Z207 Contact with and (suspected) exposure to pediculosis, acariasis and other infestations: Secondary | ICD-10-CM

## 2021-09-12 DIAGNOSIS — R21 Rash and other nonspecific skin eruption: Secondary | ICD-10-CM

## 2021-09-12 DIAGNOSIS — Z8709 Personal history of other diseases of the respiratory system: Secondary | ICD-10-CM

## 2021-09-12 MED ORDER — PERMETHRIN 5 % EX CREA: TOPICAL_CREAM | Freq: Once | CUTANEOUS | 1 refills | Status: AC

## 2021-09-12 MED ORDER — ALBUTEROL SULFATE HFA 108 (90 BASE) MCG/ACT IN AERS
1.0000 | INHALATION_SPRAY | Freq: Four times a day (QID) | RESPIRATORY_TRACT | 0 refills | Status: DC | PRN
Start: 2021-09-12 — End: 2022-10-30

## 2021-09-12 NOTE — ED Triage Notes (Signed)
Pt reports her sister had scabies a few days ago and Pt now report she has seen scabies on her breast and chest.

## 2021-09-12 NOTE — Discharge Instructions (Addendum)
I have called in permethrin.  As we discussed, you should apply this to your skin lesion over 8 hours and then wash it off in the morning.  You can do a second dose in 1 week if symptoms persist.  Make sure to wash everything in your house and hot water and dry on high heat.  If you have persistent symptoms you need to be reevaluated.    I have called in an albuterol inhaler as you requested.  If you are having to use this regularly you be reevaluated to consider additional medication.  If you have any significant shortness of breath or severe cough you need to go to the emergency room.

## 2021-09-12 NOTE — ED Provider Notes (Signed)
[facility]    CSN: [id number] Arrival date & time: 09/12/21  1524      History   Chief Complaint Chief Complaint  Patient presents with   scabies    HPI Ebony Wilson is a 20 y.o. female.   Patient presents today with a several day history of pruritic rash involving her breasts and abdomen.  Reports her sister had a similar rash that was diagnosed as scabies.  Patient has a history of scabies and reports current symptoms are similar to previous episodes of this condition.  She denies any history of additional dermatological condition including eczema or psoriasis.  She has not attempted any over-the-counter medications for symptom management.  She reports intense pruritus but denies any significant pain.  Denies any systemic symptoms including fever, nausea, vomiting, headache, dizziness.  In addition, patient requested a refill of her albuterol inhaler.  She has a history of asthma and reports having to use this more frequently in the fall and winter.  She is out of this medication and is requesting a refill.  She has been taking allergy medicine as previously prescribed.  She denies any significant cough or shortness of breath.  Reports medication adequately treat symptoms.   Past Medical History:  Diagnosis Date   COVID-19 07/09/2020   Headache    at her period    Hypertension    as a child (had to diet to get bp down, no longer an issue)   PTSD (post-traumatic stress disorder) 07/18/2016    Patient Active Problem List   Diagnosis Date Noted   Bacterial vaginosis 09/20/2018   PTSD (post-traumatic stress disorder) 07/18/2016   MDD (major depressive disorder), recurrent episode, severe (HCC) 07/17/2016    Past Surgical History:  Procedure Laterality Date   CHOLECYSTECTOMY N/A 02/08/2018   Procedure: LAPAROSCOPIC CHOLECYSTECTOMY;  Surgeon: Axel Filler, MD;  Location: [facility] OR;  Service: General;  Laterality: N/A;   TONSILECTOMY, ADENOIDECTOMY, BILATERAL  MYRINGOTOMY AND TUBES     TONSILLECTOMY     age 89   WISDOM TOOTH EXTRACTION      OB History   No obstetric history on file.      Home Medications    Prior to Admission medications   Medication Sig Start Date End Date Taking? Authorizing Provider  permethrin (ELIMITE) 5 % cream Apply topically once for 1 dose. Second treatment 1 week after first if symptoms persist. 09/12/21 09/12/21 Yes Lemon Sternberg, Noberto Retort, PA-C  acetaminophen (TYLENOL) 325 MG tablet Take 2 tablets (650 mg total) by mouth every 6 (six) hours as needed. 01/13/19   Lamptey, Britta Mccreedy, MD  albuterol (VENTOLIN HFA) 108 (90 Base) MCG/ACT inhaler Inhale 1-2 puffs into the lungs every 6 (six) hours as needed for wheezing or shortness of breath. 09/12/21   Latavious Bitter, Noberto Retort, PA-C  cetirizine (ZYRTEC) 10 MG tablet Take 1 tablet (10 mg total) by mouth daily. 09/02/21   Rhys Martini, PA-C  fluticasone (FLONASE) 50 MCG/ACT nasal spray Place 1 spray into both nostrils daily. 09/02/21   Rhys Martini, PA-C  montelukast (SINGULAIR) 10 MG tablet Take 1 tablet (10 mg total) by mouth at bedtime. 12/26/19   Bast, Gloris Manchester A, NP  naphazoline-pheniramine (NAPHCON-A) 0.025-0.3 % ophthalmic solution Place 1 drop into both eyes every 6 (six) hours as needed for eye irritation. 09/02/21   Rhys Martini, PA-C  famotidine (PEPCID) 20 MG tablet Take 1 tablet (20 mg total) by mouth 2 (two) times daily. 11/16/18 12/26/19  Domenick Gong, MD  loratadine (CLARITIN) 10 MG tablet Take 1 tablet (10 mg total) by mouth daily. One po daily x 5 days 01/16/19 12/26/19  Arby Barrette, MD    Family History Family History  Problem Relation Age of Onset   Other Mother        has a hole in her heart    Social History Social History   Tobacco Use   Smoking status: Never   Smokeless tobacco: Never  Vaping Use   Vaping Use: Never used  Substance Use Topics   Alcohol use: No   Drug use: No     Allergies   Watermelon [citrullus vulgaris] and  Pineapple   Review of Systems Review of Systems  Constitutional:  Positive for activity change. Negative for appetite change, fatigue and fever.  Respiratory:  Negative for cough and shortness of breath.   Cardiovascular:  Negative for chest pain.  Gastrointestinal:  Negative for abdominal pain, diarrhea, nausea and vomiting.  Skin:  Positive for rash.  Neurological:  Negative for dizziness, light-headedness and headaches.    Physical Exam Triage Vital Signs ED Triage Vitals  Enc Vitals Group     BP 09/12/21 1709 (!) 141/97     Pulse Rate 09/12/21 1709 72     Resp 09/12/21 1709 18     Temp 09/12/21 1709 98.3 F (36.8 C)     Temp src --      SpO2 09/12/21 1709 96 %     Weight --      Height --      Head Circumference --      Peak Flow --      Pain Score 09/12/21 1659 0     Pain Loc --      Pain Edu? --      Excl. in GC? --    No data found.  Updated Vital Signs BP (!) 141/97   Pulse 72   Temp 98.3 F (36.8 C)   Resp 18   LMP 09/12/2021   SpO2 96%   Visual Acuity Right Eye Distance:   Left Eye Distance:   Bilateral Distance:    Right Eye Near:   Left Eye Near:    Bilateral Near:     Physical Exam Vitals reviewed.  Constitutional:      General: She is awake. She is not in acute distress.    Appearance: Normal appearance. She is well-developed. She is not ill-appearing.     Comments: Very pleasant female appears stated age in no acute distress  HENT:     Head: Normocephalic and atraumatic.  Cardiovascular:     Rate and Rhythm: Normal rate and regular rhythm.     Heart sounds: Normal heart sounds, S1 normal and S2 normal. No murmur heard. Pulmonary:     Effort: Pulmonary effort is normal.     Breath sounds: Normal breath sounds. No wheezing, rhonchi or rales.     Comments: Clear to auscultation bilaterally Skin:    Findings: Rash present. Rash is macular and papular.          Comments: Maculopapular rash on anterior chest wall and lower abdomen.  No  obvious burrows on exam.  No bleeding or drainage noted.  Psychiatric:        Behavior: Behavior is cooperative.     UC Treatments / Results  Labs (all labs ordered are listed, but only abnormal results are displayed) Labs Reviewed - No data to display  EKG   Radiology No results found.  Procedures Procedures (including critical care time)  Medications Ordered in UC Medications - No data to display  Initial Impression / Assessment and Plan / UC Course  I have reviewed the triage vital signs and the nursing notes.  Pertinent labs & imaging results that were available during my care of the patient were reviewed by me and considered in my medical decision making (see chart for details).     Will empirically treat for scabies given household sick contacts in setting of pruritic rash despite not seeing burrows on exam.  Patient was prescribed permethrin and instructed to apply this to her body leave on for 8 hours and then wash off.  She can apply a second dose 1 week later if symptoms persist.  Discussed the importance of all household contacts being tested and treated as well as eating everything with hot water and hiking.  Discussed that if she develops any alarm symptoms or persistent/worsening symptoms she needs to be reevaluated.  Strict return precautions given to which she expressed understanding.  Patient was provided a refill of albuterol as requested.  Discussed that if she is requiring regular use of this or has additional symptoms including nocturnal symptoms, severe cough, shortness of breath she should be reevaluated to consider maintenance medication.  Discussed alarm symptoms that warrant emergent evaluation.  Strict return precautions given to which she expressed understanding.   Final Clinical Impressions(s) / UC Diagnoses   Final diagnoses:  Scabies exposure  Rash  History of asthma     Discharge Instructions      I have called in permethrin.  As we  discussed, you should apply this to your skin lesion over 8 hours and then wash it off in the morning.  You can do a second dose in 1 week if symptoms persist.  Make sure to wash everything in your house and hot water and dry on high heat.  If you have persistent symptoms you need to be reevaluated.    I have called in an albuterol inhaler as you requested.  If you are having to use this regularly you be reevaluated to consider additional medication.  If you have any significant shortness of breath or severe cough you need to go to the emergency room.       ED Prescriptions     Medication Sig Dispense Auth. Provider   albuterol (VENTOLIN HFA) 108 (90 Base) MCG/ACT inhaler Inhale 1-2 puffs into the lungs every 6 (six) hours as needed for wheezing or shortness of breath. 18 g [place] Emmitt K, PA-C   permethrin (ELIMITE) 5 % cream Apply topically once for 1 dose. Second treatment 1 week after first if symptoms persist. 120 g Fleming Prill K, PA-C      PDMP not reviewed this encounter.   Jeani Hawking, PA-C 09/12/21 1805

## 2021-10-07 DIAGNOSIS — Z419 Encounter for procedure for purposes other than remedying health state, unspecified: Secondary | ICD-10-CM | POA: Diagnosis not present

## 2021-11-06 DIAGNOSIS — Z419 Encounter for procedure for purposes other than remedying health state, unspecified: Secondary | ICD-10-CM | POA: Diagnosis not present

## 2021-12-07 DIAGNOSIS — Z419 Encounter for procedure for purposes other than remedying health state, unspecified: Secondary | ICD-10-CM | POA: Diagnosis not present

## 2022-01-07 DIAGNOSIS — Z419 Encounter for procedure for purposes other than remedying health state, unspecified: Secondary | ICD-10-CM | POA: Diagnosis not present

## 2022-02-04 DIAGNOSIS — Z419 Encounter for procedure for purposes other than remedying health state, unspecified: Secondary | ICD-10-CM | POA: Diagnosis not present

## 2022-03-07 DIAGNOSIS — Z419 Encounter for procedure for purposes other than remedying health state, unspecified: Secondary | ICD-10-CM | POA: Diagnosis not present

## 2022-03-30 IMAGING — DX DG WRIST COMPLETE 3+V*R*
4 series · 4 of 4 positions shown · non-contrast
Comparison: None.

CLINICAL DATA: Right wrist and hand pain.  Injury.

EXAM:
RIGHT WRIST - COMPLETE 3+ VIEW

[wrist pa]
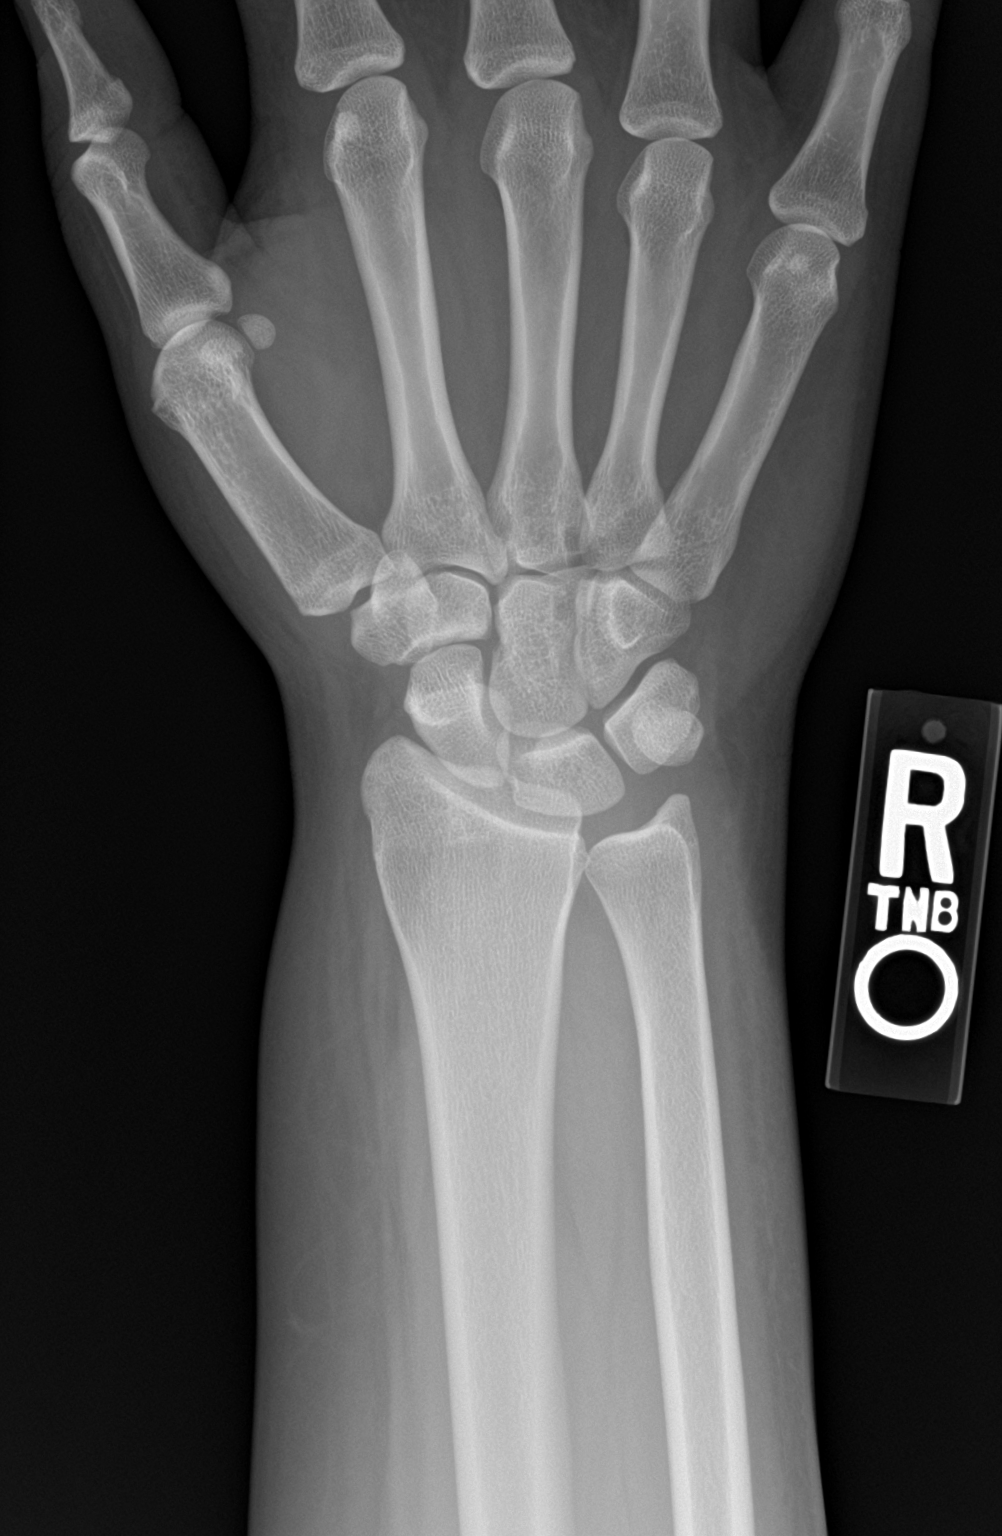

[wrist navicular]
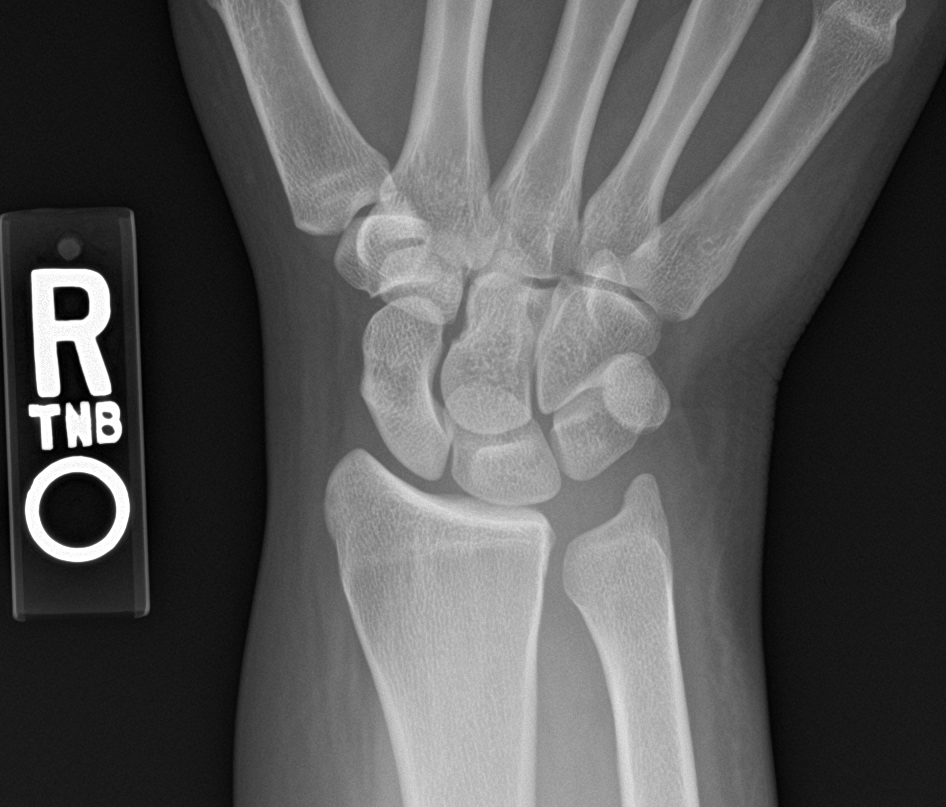

[wrist obl]
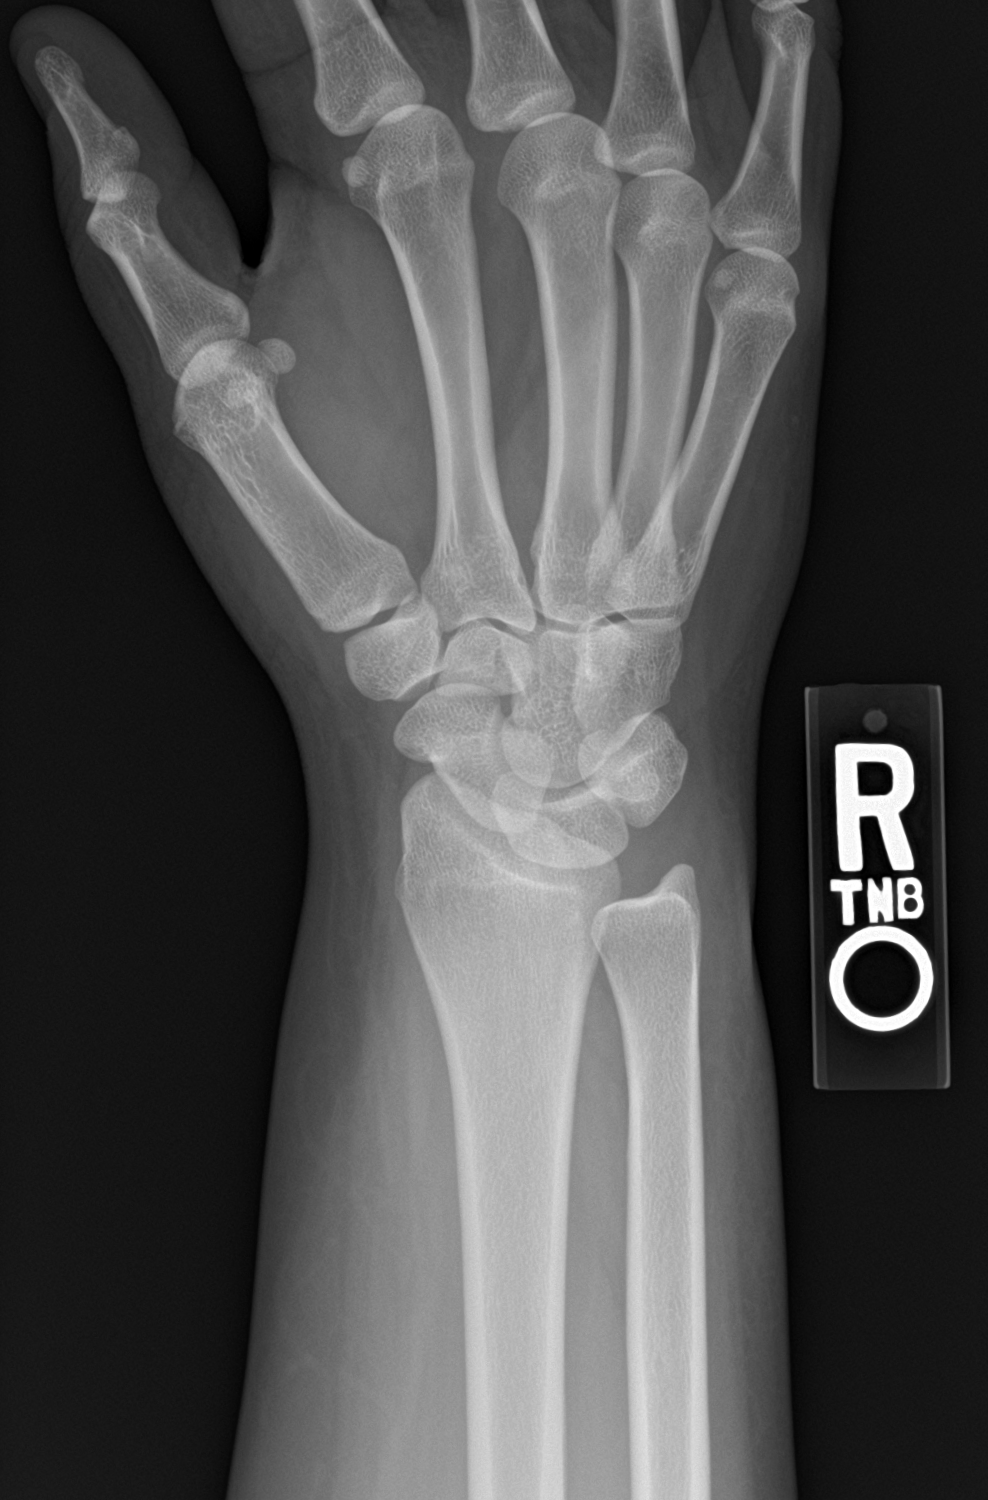

[wrist lat]
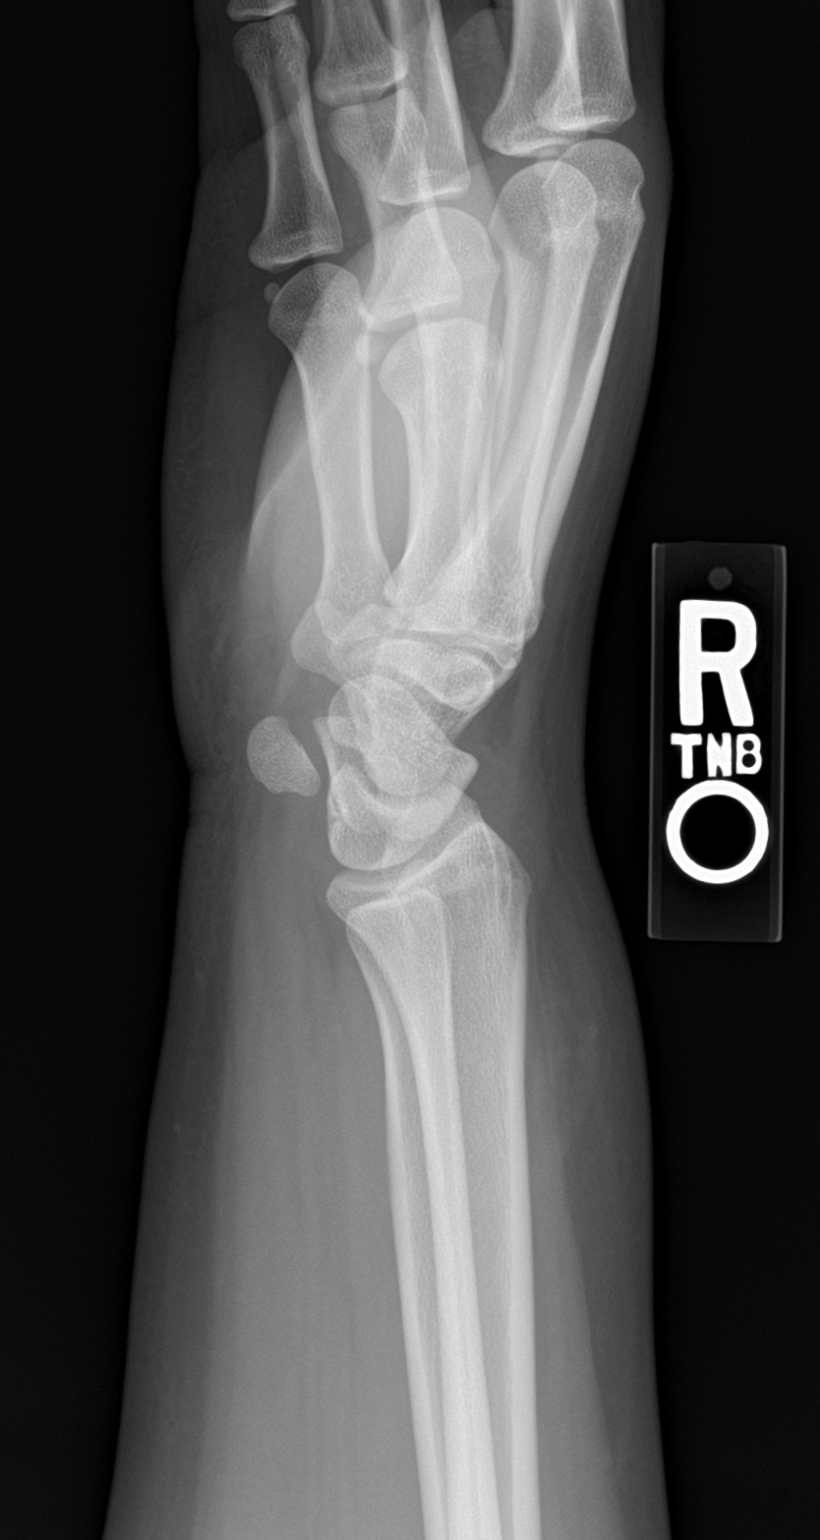

[4 of 4 positions shown; findings below may reference images not displayed]

FINDINGS: There is no evidence of fracture or dislocation. There is no
evidence of arthropathy or other focal bone abnormality. Soft
tissues are unremarkable.
IMPRESSION: Negative.

## 2022-04-06 DIAGNOSIS — Z419 Encounter for procedure for purposes other than remedying health state, unspecified: Secondary | ICD-10-CM | POA: Diagnosis not present

## 2022-05-07 DIAGNOSIS — Z419 Encounter for procedure for purposes other than remedying health state, unspecified: Secondary | ICD-10-CM | POA: Diagnosis not present

## 2022-06-06 DIAGNOSIS — Z419 Encounter for procedure for purposes other than remedying health state, unspecified: Secondary | ICD-10-CM | POA: Diagnosis not present

## 2022-07-06 ENCOUNTER — Ambulatory Visit: Payer: Self-pay

## 2022-07-06 ENCOUNTER — Other Ambulatory Visit: Payer: Self-pay | Admitting: Nurse Practitioner

## 2022-07-06 DIAGNOSIS — M25571 Pain in right ankle and joints of right foot: Secondary | ICD-10-CM

## 2022-07-06 DIAGNOSIS — M79671 Pain in right foot: Secondary | ICD-10-CM

## 2022-07-07 DIAGNOSIS — Z419 Encounter for procedure for purposes other than remedying health state, unspecified: Secondary | ICD-10-CM | POA: Diagnosis not present

## 2022-08-07 DIAGNOSIS — Z419 Encounter for procedure for purposes other than remedying health state, unspecified: Secondary | ICD-10-CM | POA: Diagnosis not present

## 2022-09-06 DIAGNOSIS — Z419 Encounter for procedure for purposes other than remedying health state, unspecified: Secondary | ICD-10-CM | POA: Diagnosis not present

## 2022-10-07 DIAGNOSIS — Z419 Encounter for procedure for purposes other than remedying health state, unspecified: Secondary | ICD-10-CM | POA: Diagnosis not present

## 2022-10-25 ENCOUNTER — Ambulatory Visit (HOSPITAL_COMMUNITY)
Admission: RE | Admit: 2022-10-25 | Discharge: 2022-10-25 | Disposition: A | Discharge status: Home / Self Care | Payer: Medicaid Other | Source: Ambulatory Visit | Attending: Emergency Medicine | Admitting: Emergency Medicine

## 2022-10-25 ENCOUNTER — Encounter (HOSPITAL_COMMUNITY): Payer: Self-pay

## 2022-10-25 VITALS — BP 138/85 | HR 110 | Temp 98.8°F | Resp 16

## 2022-10-25 DIAGNOSIS — Z3202 Encounter for pregnancy test, result negative: Secondary | ICD-10-CM | POA: Diagnosis not present

## 2022-10-25 DIAGNOSIS — Z113 Encounter for screening for infections with a predominantly sexual mode of transmission: Secondary | ICD-10-CM | POA: Diagnosis not present

## 2022-10-25 DIAGNOSIS — N926 Irregular menstruation, unspecified: Secondary | ICD-10-CM | POA: Insufficient documentation

## 2022-10-25 DIAGNOSIS — R102 Pelvic and perineal pain: Secondary | ICD-10-CM | POA: Diagnosis not present

## 2022-10-25 LAB — HIV ANTIBODY (ROUTINE TESTING W REFLEX): HIV Screen 4th Generation wRfx: NONREACTIVE

## 2022-10-25 LAB — POC URINE PREG, ED: Preg Test, Ur: NEGATIVE

## 2022-10-25 NOTE — ED Provider Notes (Signed)
[facility]    CSN: [id number] Arrival date & time: 10/25/22  1338     History   Chief Complaint Chief Complaint  Patient presents with   Possible Pregnancy    And blood work done for any STI OR STD - Entered by patient    HPI Ebony Wilson is a 21 y.o. female.  Presents for pregnancy test and STD testing Reports LMP 9/6, missed October and November cycles  Reports she has not been able to keep anything down, food or fluids, for 1 month, every day of the month Intermittent lower abdominal/pelvic discomfort. Denies urinary symptoms. No fevers. No vaginal bleeding or spotting  Would like blood work STD testing as well Denies symptoms or known exposures   Past Medical History:  Diagnosis Date   COVID-19 07/09/2020   Headache    at her period    Hypertension    as a child (had to diet to get bp down, no longer an issue)   PTSD (post-traumatic stress disorder) 07/18/2016    Patient Active Problem List   Diagnosis Date Noted   Bacterial vaginosis 09/20/2018   PTSD (post-traumatic stress disorder) 07/18/2016   MDD (major depressive disorder), recurrent episode, severe (HCC) 07/17/2016    Past Surgical History:  Procedure Laterality Date   CHOLECYSTECTOMY N/A 02/08/2018   Procedure: LAPAROSCOPIC CHOLECYSTECTOMY;  Surgeon: Axel Filler, MD;  Location: [facility];  Service: General;  Laterality: N/A;   TONSILECTOMY, ADENOIDECTOMY, BILATERAL MYRINGOTOMY AND TUBES     TONSILLECTOMY     age 52   WISDOM TOOTH EXTRACTION      OB History   No obstetric history on file.      Home Medications    Prior to Admission medications   Medication Sig Start Date End Date Taking? Authorizing Provider  acetaminophen (TYLENOL) 325 MG tablet Take 2 tablets (650 mg total) by mouth every 6 (six) hours as needed. 01/13/19   Lamptey, Britta Mccreedy, MD  albuterol (VENTOLIN HFA) 108 (90 Base) MCG/ACT inhaler Inhale 1-2 puffs into the lungs every 6 (six) hours as needed for wheezing or  shortness of breath. 09/12/21   Raspet, Noberto Retort, PA-C  cetirizine (ZYRTEC) 10 MG tablet Take 1 tablet (10 mg total) by mouth daily. 09/02/21   Rhys Martini, PA-C  fluticasone (FLONASE) 50 MCG/ACT nasal spray Place 1 spray into both nostrils daily. 09/02/21   Rhys Martini, PA-C  montelukast (SINGULAIR) 10 MG tablet Take 1 tablet (10 mg total) by mouth at bedtime. 12/26/19   Bast, Gloris Manchester A, NP  naphazoline-pheniramine (NAPHCON-A) 0.025-0.3 % ophthalmic solution Place 1 drop into both eyes every 6 (six) hours as needed for eye irritation. 09/02/21   Rhys Martini, PA-C  famotidine (PEPCID) 20 MG tablet Take 1 tablet (20 mg total) by mouth 2 (two) times daily. 11/16/18 12/26/19  Domenick Gong, MD  loratadine (CLARITIN) 10 MG tablet Take 1 tablet (10 mg total) by mouth daily. One po daily x 5 days 01/16/19 12/26/19  Arby Barrette, MD    Family History Family History  Problem Relation Age of Onset   Other Mother        has a hole in her heart    Social History Social History   Tobacco Use   Smoking status: Never   Smokeless tobacco: Never  Vaping Use   Vaping Use: Never used  Substance Use Topics   Alcohol use: No   Drug use: No     Allergies   Watermelon [citrullus  vulgaris] and Pineapple   Review of Systems Review of Systems Per HPI  Physical Exam Triage Vital Signs ED Triage Vitals  Enc Vitals Group     BP 10/25/22 1420 138/85     Pulse Rate 10/25/22 1420 (!) 110     Resp 10/25/22 1420 16     Temp 10/25/22 1420 98.8 F (37.1 C)     Temp Source 10/25/22 1420 Oral     SpO2 10/25/22 1420 100 %     Weight --      Height --      Head Circumference --      Peak Flow --      Pain Score 10/25/22 1423 0     Pain Loc --      Pain Edu? --      Excl. in GC? --    No data found.  Updated Vital Signs BP 138/85 (BP Location: Left Wrist)   Pulse (!) 110   Temp 98.8 F (37.1 C) (Oral)   Resp 16   LMP 08/12/2022   SpO2 100%   Physical Exam Vitals and nursing note  reviewed.  Constitutional:      General: She is not in acute distress.    Appearance: She is not ill-appearing.     Comments: Well appearing in no acute distress  HENT:     Mouth/Throat:     Mouth: Mucous membranes are moist.     Pharynx: Oropharynx is clear.  Cardiovascular:     Rate and Rhythm: Normal rate and regular rhythm.     Pulses: Normal pulses.  Pulmonary:     Effort: Pulmonary effort is normal.  Abdominal:     General: Abdomen is protuberant.     Tenderness: There is abdominal tenderness in the suprapubic area. There is no right CVA tenderness, left CVA tenderness, guarding or rebound.     Comments: Body habitus limites exam  Skin:    General: Skin is warm and dry.  Neurological:     Mental Status: She is alert and oriented to person, place, and time.     UC Treatments / Results  Labs (all labs ordered are listed, but only abnormal results are displayed) Labs Reviewed  HIV ANTIBODY (ROUTINE TESTING W REFLEX)  RPR  POC URINE PREG, ED  CERVICOVAGINAL ANCILLARY ONLY    EKG  Radiology No results found.  Procedures Procedures   Medications Ordered in UC Medications - No data to display  Initial Impression / Assessment and Plan / UC Course  I have reviewed the triage vital signs and the nursing notes.  Pertinent labs & imaging results that were available during my care of the patient were reviewed by me and considered in my medical decision making (see chart for details).  Urine pregnancy negative Recommend following up with ob/gyn regarding missed cycles. Provided clinic info  Abdominal discomfort Could be cramping/onset of cycle vs STD No red flags today Patient denies urinary symptoms and will defer urinalysis Recommend try tylenol for pain, discuss with ob/gyn when established  Was able to sip water in clinic and kept down, witnessed by this provider  She is overall well-appearing  Cytology swab, HIV/RPR pending. Discussed waiting for results  and treating as needed  Considered evaluation in ED since patient reported unable to keep anything down, although after p.o challenge successful I feel comfortable sending her home with strict ED precautions. ED for worsening symptoms including abdominal pain that becomes severe or does not respond to medicine, unable  to tolerate liquids by mouth. Patient verbalized understanding  Final Clinical Impressions(s) / UC Diagnoses   Final diagnoses:  Missed menses  Screen for STD (sexually transmitted disease)  Pregnancy examination or test, negative result  Pelvic pain     Discharge Instructions      Try tylenol for pelvic pain  I recommend establishing with women's clinic regarding your missed menstrual cycles.  Please go to the emergency department if symptoms worsen.     ED Prescriptions   None    PDMP not reviewed this encounter.   Marlow Baars, [place] 10/25/22 1617

## 2022-10-25 NOTE — Discharge Instructions (Addendum)
Try tylenol for pelvic pain  I recommend establishing with women's clinic regarding your missed menstrual cycles.  Please go to the emergency department if symptoms worsen.

## 2022-10-25 NOTE — ED Triage Notes (Signed)
Pt has not been having a period x 70month going on 2 months. Pt has been vomiting x 1 month unable to keep food down or fluid . Pt is here for STD testing .

## 2022-10-26 LAB — CERVICOVAGINAL ANCILLARY ONLY
Bacterial Vaginitis (gardnerella): POSITIVE — AB
Candida Glabrata: NEGATIVE
Candida Vaginitis: NEGATIVE
Chlamydia: NEGATIVE
Comment: NEGATIVE
Comment: NEGATIVE
Comment: NEGATIVE
Comment: NEGATIVE
Comment: NEGATIVE
Comment: NORMAL
Neisseria Gonorrhea: NEGATIVE
Trichomonas: NEGATIVE

## 2022-10-26 LAB — RPR: RPR Ser Ql: NONREACTIVE

## 2022-10-27 ENCOUNTER — Telehealth (HOSPITAL_COMMUNITY): Payer: Self-pay | Admitting: Emergency Medicine

## 2022-10-27 MED ORDER — METRONIDAZOLE 500 MG PO TABS: 500.0000 mg | ORAL_TABLET | Freq: Two times a day (BID) | ORAL | 0 refills | Status: DC

## 2022-10-30 ENCOUNTER — Telehealth: Payer: Medicaid Other | Admitting: Physician Assistant

## 2022-10-30 DIAGNOSIS — B9689 Other specified bacterial agents as the cause of diseases classified elsewhere: Secondary | ICD-10-CM

## 2022-10-30 DIAGNOSIS — J302 Other seasonal allergic rhinitis: Secondary | ICD-10-CM

## 2022-10-30 DIAGNOSIS — N760 Acute vaginitis: Secondary | ICD-10-CM | POA: Diagnosis not present

## 2022-10-30 DIAGNOSIS — J4520 Mild intermittent asthma, uncomplicated: Secondary | ICD-10-CM | POA: Diagnosis not present

## 2022-10-30 MED ORDER — ALBUTEROL SULFATE HFA 108 (90 BASE) MCG/ACT IN AERS: 1.0000 | INHALATION_SPRAY | Freq: Four times a day (QID) | RESPIRATORY_TRACT | 0 refills | Status: DC | PRN

## 2022-10-30 MED ORDER — MONTELUKAST SODIUM 10 MG PO TABS: 10.0000 mg | ORAL_TABLET | Freq: Every day | ORAL | 1 refills | Status: DC

## 2022-10-30 MED ORDER — CETIRIZINE HCL 10 MG PO TABS: 10.0000 mg | ORAL_TABLET | Freq: Every day | ORAL | 0 refills | Status: DC

## 2022-10-30 MED ORDER — METRONIDAZOLE 500 MG PO TABS: 500.0000 mg | ORAL_TABLET | Freq: Two times a day (BID) | ORAL | 0 refills | Status: DC

## 2022-10-30 MED ORDER — FLUTICASONE PROPIONATE 50 MCG/ACT NA SUSP: 1.0000 | Freq: Every day | NASAL | 2 refills | Status: DC

## 2022-10-30 NOTE — Progress Notes (Signed)
Virtual Visit Consent   Ebony Wilson, you are scheduled for a virtual visit with a [facility] provider today. Just as with appointments in the office, your consent must be obtained to participate. Your consent will be active for this visit and any virtual visit you may have with one of our providers in the next 365 days. If you have a MyChart account, a copy of this consent can be sent to you electronically.  As this is a virtual visit, video technology does not allow for your provider to perform a traditional examination. This may limit your provider's ability to fully assess your condition. If your provider identifies any concerns that need to be evaluated in person or the need to arrange testing (such as labs, EKG, etc.), we will make arrangements to do so. Although advances in technology are sophisticated, we cannot ensure that it will always work on either your end or our end. If the connection with a video visit is poor, the visit may have to be switched to a telephone visit. With either a video or telephone visit, we are not always able to ensure that we have a secure connection.  By engaging in this virtual visit, you consent to the provision of healthcare and authorize for your insurance to be billed (if applicable) for the services provided during this visit. Depending on your insurance coverage, you may receive a charge related to this service.  I need to obtain your verbal consent now. Are you willing to proceed with your visit today? Melina Mosteller has provided verbal consent on 10/30/2022 for a virtual visit (video or telephone). Margaretann Loveless, PA-C  Date: 10/30/2022 2:40 PM  Virtual Visit via Video Note   I, Margaretann Loveless, connected with  Ebony Wilson  ([id number], [birth year]) on 10/30/22 at  2:30 PM EST by a video-enabled telemedicine application and verified that I am speaking with the correct person using two identifiers.  Location: Patient: Virtual Visit Location  Patient: Home Provider: Virtual Visit Location Provider: Home Office   I discussed the limitations of evaluation and management by telemedicine and the availability of in person appointments. The patient expressed understanding and agreed to proceed.    History of Present Illness: Ebony Wilson is a 21 y.o. who identifies as a female who was assigned female at birth, and is being seen today for acute cough and refill of medications. She reports she is having some increased seasonal allergies that is aggravating her asthma and making her cough. She is requesting a refill on her albuterol inhaler and her allergy medications (Zyrtec, flonase, and singulair).   She was also seen in person at a local UC and tested positive for BV. She reports that she called her pharmacy and they never received the Metronidazole prescription, even though it appears it had been received through the epic system. She has not started this medication.   Problems:  Patient Active Problem List   Diagnosis Date Noted   Bacterial vaginosis 09/20/2018   PTSD (post-traumatic stress disorder) 07/18/2016   MDD (major depressive disorder), recurrent episode, severe (HCC) 07/17/2016    Allergies:  Allergies  Allergen Reactions   Watermelon [Citrullus Vulgaris] Itching and Swelling    Makes lips swell and throat itch!! NO MELONS!!   Pineapple Swelling   Medications:  Current Outpatient Medications:    acetaminophen (TYLENOL) 325 MG tablet, Take 2 tablets (650 mg total) by mouth every 6 (six) hours as needed., Disp: , Rfl:    albuterol (VENTOLIN HFA)  108 (90 Base) MCG/ACT inhaler, Inhale 1-2 puffs into the lungs every 6 (six) hours as needed for wheezing or shortness of breath., Disp: 18 g, Rfl: 0   cetirizine (ZYRTEC) 10 MG tablet, Take 1 tablet (10 mg total) by mouth daily., Disp: 30 tablet, Rfl: 0   fluticasone (FLONASE) 50 MCG/ACT nasal spray, Place 1 spray into both nostrils daily., Disp: 16 g, Rfl: 2   metroNIDAZOLE  (FLAGYL) 500 MG tablet, Take 1 tablet (500 mg total) by mouth 2 (two) times daily., Disp: 14 tablet, Rfl: 0   montelukast (SINGULAIR) 10 MG tablet, Take 1 tablet (10 mg total) by mouth at bedtime., Disp: 30 tablet, Rfl: 1   naphazoline-pheniramine (NAPHCON-A) 0.025-0.3 % ophthalmic solution, Place 1 drop into both eyes every 6 (six) hours as needed for eye irritation., Disp: 15 mL, Rfl: 1  Observations/Objective: Patient is well-developed, well-nourished in no acute distress.  Resting comfortably at home.  Head is normocephalic, atraumatic.  No labored breathing.  Speech is clear and coherent with logical content.  Patient is alert and oriented at baseline.    Assessment and Plan: 1. Mild intermittent asthma without complication - montelukast (SINGULAIR) 10 MG tablet; Take 1 tablet (10 mg total) by mouth at bedtime.  Dispense: 30 tablet; Refill: 1 - albuterol (VENTOLIN HFA) 108 (90 Base) MCG/ACT inhaler; Inhale 1-2 puffs into the lungs every 6 (six) hours as needed for wheezing or shortness of breath.  Dispense: 18 g; Refill: 0  2. Seasonal allergies - cetirizine (ZYRTEC) 10 MG tablet; Take 1 tablet (10 mg total) by mouth daily.  Dispense: 30 tablet; Refill: 0 - fluticasone (FLONASE) 50 MCG/ACT nasal spray; Place 1 spray into both nostrils daily.  Dispense: 16 g; Refill: 2 - montelukast (SINGULAIR) 10 MG tablet; Take 1 tablet (10 mg total) by mouth at bedtime.  Dispense: 30 tablet; Refill: 1  3. BV (bacterial vaginosis) - metroNIDAZOLE (FLAGYL) 500 MG tablet; Take 1 tablet (500 mg total) by mouth 2 (two) times daily.  Dispense: 14 tablet; Refill: 0  - Will refill Zyrtec, Flonase, Singulair, and Albuterol as requested - Metronidazole re-sent to pharmacy as well; patient denies vaginal discharge, but does report having suprapubic pain and pressure, fatigue, malaise, chills, and some vaginal spotting - Advised if symptoms of cough continue to worsen despite starting above medications she  should follow up - [facility]   Follow Up Instructions: I discussed the assessment and treatment plan with the patient. The patient was provided an opportunity to ask questions and all were answered. The patient agreed with the plan and demonstrated an understanding of the instructions.  A copy of instructions were sent to the patient via MyChart unless otherwise noted below.    The patient was advised to call back or seek an in-person evaluation if the symptoms worsen or if the condition fails to improve as anticipated.  Time:  I spent 13 minutes with the patient via telehealth technology discussing the above problems/concerns.    Margaretann Loveless, PA-C

## 2022-10-30 NOTE — Patient Instructions (Signed)
Ebony Wilson, thank you for joining Margaretann Loveless, PA-C for today's virtual visit.  While this provider is not your primary care provider (PCP), if your PCP is located in our provider database this encounter information will be shared with them immediately following your visit.   A [facility] MyChart account gives you access to today's visit and all your visits, tests, and labs performed at [facility] " click here if you don't have a [facility] MyChart account or go to [email or web address]  Consent: (Patient) Ebony Wilson provided verbal consent for this virtual visit at the beginning of the encounter.  Current Medications:  Current Outpatient Medications:    acetaminophen (TYLENOL) 325 MG tablet, Take 2 tablets (650 mg total) by mouth every 6 (six) hours as needed., Disp: , Rfl:    albuterol (VENTOLIN HFA) 108 (90 Base) MCG/ACT inhaler, Inhale 1-2 puffs into the lungs every 6 (six) hours as needed for wheezing or shortness of breath., Disp: 18 g, Rfl: 0   cetirizine (ZYRTEC) 10 MG tablet, Take 1 tablet (10 mg total) by mouth daily., Disp: 30 tablet, Rfl: 0   fluticasone (FLONASE) 50 MCG/ACT nasal spray, Place 1 spray into both nostrils daily., Disp: 16 g, Rfl: 2   metroNIDAZOLE (FLAGYL) 500 MG tablet, Take 1 tablet (500 mg total) by mouth 2 (two) times daily., Disp: 14 tablet, Rfl: 0   montelukast (SINGULAIR) 10 MG tablet, Take 1 tablet (10 mg total) by mouth at bedtime., Disp: 30 tablet, Rfl: 1   naphazoline-pheniramine (NAPHCON-A) 0.025-0.3 % ophthalmic solution, Place 1 drop into both eyes every 6 (six) hours as needed for eye irritation., Disp: 15 mL, Rfl: 1   Medications ordered in this encounter:  Meds ordered this encounter  Medications   metroNIDAZOLE (FLAGYL) 500 MG tablet    Sig: Take 1 tablet (500 mg total) by mouth 2 (two) times daily.    Dispense:  14 tablet    Refill:  0    Order Specific Question:   Supervising Provider    Answer:   Merrilee Jansky [[id number]]   cetirizine (ZYRTEC) 10 MG tablet    Sig: Take 1 tablet (10 mg total) by mouth daily.    Dispense:  30 tablet    Refill:  0    Order Specific Question:   Supervising Provider    Answer:   Merrilee Jansky [[id number]]   fluticasone (FLONASE) 50 MCG/ACT nasal spray    Sig: Place 1 spray into both nostrils daily.    Dispense:  16 g    Refill:  2    Order Specific Question:   Supervising Provider    Answer:   LAMPTEY, PHILIP O [[id number]]   montelukast (SINGULAIR) 10 MG tablet    Sig: Take 1 tablet (10 mg total) by mouth at bedtime.    Dispense:  30 tablet    Refill:  1    Order Specific Question:   Supervising Provider    Answer:   Loreli Dollar   albuterol (VENTOLIN HFA) 108 (90 Base) MCG/ACT inhaler    Sig: Inhale 1-2 puffs into the lungs every 6 (six) hours as needed for wheezing or shortness of breath.    Dispense:  18 g    Refill:  0    Order Specific Question:   Supervising Provider    Answer:   Merrilee Jansky [id number]     *If you need refills on other medications prior to your next appointment, please contact your pharmacy*  Follow-Up: Call back or seek an in-person evaluation if the symptoms worsen or if the condition fails to improve as anticipated.   Virtual Care [phone]  Other Instructions  Bacterial Vaginosis  Bacterial vaginosis is an infection of the vagina. It happens when too many normal germs (healthy bacteria) grow in the vagina. This infection can make it easier to get other infections from sex (STIs). It is very important for pregnant women to get treated. This infection can cause babies to be born early or at a low birth weight. What are the causes? This infection is caused by an increase in certain germs that grow in the vagina. You cannot get this infection from toilet seats, bedsheets, swimming pools, or things that touch your vagina. What increases the risk? Having sex with a new person or more than  one person. Having sex without protection. Douching. Having an intrauterine device (IUD). Smoking. Using drugs or drinking alcohol. These can lead you to do things that are risky. Taking certain antibiotic medicines. Being pregnant. What are the signs or symptoms? Some women have no symptoms. Symptoms may include: A discharge from your vagina. It may be gray or white. It can be watery or foamy. A fishy smell. This can happen after sex or during your menstrual period. Itching in and around your vagina. A feeling of burning or pain when you pee (urinate). How is this treated? This infection is treated with antibiotic medicines. These may be given to you as: A pill. A cream for your vagina. A medicine that you put into your vagina (suppository). If the infection comes back after treatment, you may need more antibiotics. Follow these instructions at home: Medicines Take over-the-counter and prescription medicines as told by your doctor. Take or use your antibiotic medicine as told by your doctor. Do not stop taking or using it, even if you start to feel better. General instructions If the person you have sex with is a woman, tell her that you have this infection. She will need to follow up with her doctor. If you have a female partner, he does not need to be treated. Do not have sex until you finish treatment. Drink enough fluid to keep your pee pale yellow. Keep your vagina and butt clean. Wash the area with warm water each day. Wipe from front to back after you use the toilet. If you are breastfeeding a baby, ask your doctor if you should keep doing so during treatment. Keep all follow-up visits. How is this prevented? Self-care Do not douche. Use only warm water to wash around your vagina. Wear underwear that is cotton or lined with cotton. Do not wear tight pants and pantyhose, especially in the summer. Safe sex Use protection when you have sex. This includes: Use condoms. Use  dental dams. This is a thin layer that protects the mouth during oral sex. Limit how many people you have sex with. To prevent this infection, it is best to have sex with just one person. Get tested for STIs. The person you have sex with should also get tested. Drugs and alcohol Do not smoke or use any products that contain nicotine or tobacco. If you need help quitting, ask your doctor. Do not use drugs. Do not drink alcohol if: Your doctor tells you not to drink. You are pregnant, may be pregnant, or are planning to become pregnant. If you drink alcohol: Limit how much you have to 0-1 drink a day. Know how much alcohol is in  your drink. In the [place] one drink equals one 12 oz bottle of beer (355 mL), one 5 oz glass of wine (148 mL), or one 1 oz glass of hard liquor (44 mL). Where to find more information Centers for Disease Control and Prevention: FootballExhibition.com.br American Sexual Health Association: www.ashastd.org Office on [employer] Health: http://hoffman.com/ Contact a doctor if: Your symptoms do not get better, even after you are treated. You have more discharge or pain when you pee. You have a fever or chills. You have pain in your belly (abdomen) or in the area between your hips. You have pain with sex. You bleed from your vagina between menstrual periods. Summary This infection can happen when too many germs (bacteria) grow in the vagina. This infection can make it easier to get infections from sex (STIs). Treating this can lower that chance. Get treated if you are pregnant. This infection can cause babies to be born early. Do not stop taking or using your antibiotic medicine, even if you start to feel better. This information is not intended to replace advice given to you by your health care provider. Make sure you discuss any questions you have with your health care provider. Document Revised: 05/23/2020 Document Reviewed: 05/23/2020 Elsevier Patient Education  2023 Elsevier  Inc.    If you have been instructed to have an in-person evaluation today at a local Urgent Care facility, please use the link below. It will take you to a list of all of our available [facility], including address, phone number and hours of operation. Please do not delay care.  [facility]  If you or a family member do not have a primary care provider, use the link below to schedule a visit and establish care. When you choose a [facility] primary care physician or advanced practice provider, you gain a long-term partner in health. Find a Primary Care Provider  Learn more about [facility]'s in-office and virtual care options: [facility] - Get Care Now

## 2022-11-06 DIAGNOSIS — Z419 Encounter for procedure for purposes other than remedying health state, unspecified: Secondary | ICD-10-CM | POA: Diagnosis not present

## 2022-11-08 ENCOUNTER — Telehealth: Payer: Medicaid Other | Admitting: Family

## 2022-11-08 DIAGNOSIS — R112 Nausea with vomiting, unspecified: Secondary | ICD-10-CM

## 2022-11-08 NOTE — Progress Notes (Signed)
Because your symptoms have been on going for more than 3 days, I feel your condition warrants further evaluation and I recommend that you be seen in a face to face visit to rule out a more serious illness.   NOTE: There will be NO CHARGE for this eVisit   If you are having a true medical emergency please call 911.      For an urgent face to face visit, [facility] has seven urgent care centers for your convenience:     [facility] Health Urgent Care Center at [facility] Directions [phone] [address] [place], [place] [address]    [facility] Health Urgent Care Center [facility]) Get Driving Directions [phone] [address] [place], [place] [address]  [facility] Health Urgent Care Center [facility]) Get Driving Directions [phone] [address] [place],  [place]  [address]  [facility] Health Urgent Care Center [facility] at [employer] Directions  [phone] [address] [place],  [place] [address]   [facility] Health Urgent Care at [facility] Get Driving Directions [phone] [address], [address] [place], [place] [address]   [facility] Health Urgent Care at [facility] Get Driving Directions  [phone] [address] [place], [place] [address]   [facility] Health Urgent Care at [facility] Directions [phone] [address], [address] [place], [place] [address]  Your MyChart E-visit questionnaire answers were reviewed by a board certified advanced clinical practitioner to complete your personal care plan based on your specific symptoms.  Thank you for using e-Visits.

## 2022-11-09 ENCOUNTER — Ambulatory Visit (HOSPITAL_COMMUNITY): Payer: Medicaid Other

## 2022-11-15 ENCOUNTER — Ambulatory Visit (HOSPITAL_COMMUNITY): Payer: Medicaid Other

## 2022-11-23 DIAGNOSIS — Z3202 Encounter for pregnancy test, result negative: Secondary | ICD-10-CM | POA: Diagnosis not present

## 2022-11-23 DIAGNOSIS — N912 Amenorrhea, unspecified: Secondary | ICD-10-CM | POA: Diagnosis not present

## 2022-11-23 DIAGNOSIS — Z7251 High risk heterosexual behavior: Secondary | ICD-10-CM | POA: Diagnosis not present

## 2022-12-07 DIAGNOSIS — Z419 Encounter for procedure for purposes other than remedying health state, unspecified: Secondary | ICD-10-CM | POA: Diagnosis not present

## 2022-12-28 ENCOUNTER — Ambulatory Visit (HOSPITAL_COMMUNITY)
Admission: RE | Admit: 2022-12-28 | Discharge: 2022-12-28 | Disposition: A | Discharge status: Home / Self Care | Payer: Medicaid Other | Source: Ambulatory Visit | Attending: Internal Medicine | Admitting: Internal Medicine

## 2022-12-28 ENCOUNTER — Encounter (HOSPITAL_COMMUNITY): Payer: Self-pay

## 2022-12-28 VITALS — BP 148/110 | HR 84 | Temp 98.7°F | Resp 16

## 2022-12-28 DIAGNOSIS — M549 Dorsalgia, unspecified: Secondary | ICD-10-CM

## 2022-12-28 NOTE — ED Provider Notes (Signed)
[place]    CSN: [id number] Arrival date & time: 12/28/22  1344      History   Chief Complaint Chief Complaint  Patient presents with   Back Pain    Entered by patient    HPI Zaineb Akridge is a 22 y.o. female currently 2 months pregnant comes to the urgent care with 2 to 38-month history of low back pain.  Patient denies any falls or trauma to the back.  No urinary symptoms or problems.  Pain does not radiate.  Pain is of mild to moderate severity.  It is aggravated by movement.  No known relieving factors.  Pain is currently intermittent in nature.  Patient denies any heavy lifting.  No weakness in the lower extremities.  No perineal numbness or tingling. HPI  Past Medical History:  Diagnosis Date   COVID-19 07/09/2020   Headache    at her period    Hypertension    as a child (had to diet to get bp down, no longer an issue)   PTSD (post-traumatic stress disorder) 07/18/2016    Patient Active Problem List   Diagnosis Date Noted   Bacterial vaginosis 09/20/2018   PTSD (post-traumatic stress disorder) 07/18/2016   MDD (major depressive disorder), recurrent episode, severe ([place]) 07/17/2016    Past Surgical History:  Procedure Laterality Date   CHOLECYSTECTOMY N/A 02/08/2018   Procedure: LAPAROSCOPIC CHOLECYSTECTOMY;  Surgeon: Ralene Ok, MD;  Location: [facility];  Service: General;  Laterality: N/A;   TONSILECTOMY, ADENOIDECTOMY, BILATERAL MYRINGOTOMY AND TUBES     TONSILLECTOMY     age 43   WISDOM TOOTH EXTRACTION      OB History   No obstetric history on file.      Home Medications    Prior to Admission medications   Medication Sig Start Date End Date Taking? Authorizing Provider  acetaminophen (TYLENOL) 325 MG tablet Take 2 tablets (650 mg total) by mouth every 6 (six) hours as needed. 01/13/19  Yes Sadako Cegielski, Myrene Galas, MD  albuterol (VENTOLIN HFA) 108 (90 Base) MCG/ACT inhaler Inhale 1-2 puffs into the lungs every 6 (six) hours as needed for  wheezing or shortness of breath. 10/30/22  Yes Mar Daring, PA-C  cetirizine (ZYRTEC) 10 MG tablet Take 1 tablet (10 mg total) by mouth daily. 10/30/22  Yes Fenton Malling M, PA-C  fluticasone (FLONASE) 50 MCG/ACT nasal spray Place 1 spray into both nostrils daily. 10/30/22  Yes Fenton Malling M, PA-C  montelukast (SINGULAIR) 10 MG tablet Take 1 tablet (10 mg total) by mouth at bedtime. 10/30/22  Yes Mar Daring, PA-C  famotidine (PEPCID) 20 MG tablet Take 1 tablet (20 mg total) by mouth 2 (two) times daily. 11/16/18 12/26/19  Melynda Ripple, MD  loratadine (CLARITIN) 10 MG tablet Take 1 tablet (10 mg total) by mouth daily. One po daily x 5 days 01/16/19 12/26/19  Charlesetta Shanks, MD    Family History Family History  Problem Relation Age of Onset   Other Mother        has a hole in her heart    Social History Social History   Tobacco Use   Smoking status: Never   Smokeless tobacco: Never  Vaping Use   Vaping Use: Never used  Substance Use Topics   Alcohol use: No   Drug use: No     Allergies   Watermelon [citrullus vulgaris] and Pineapple   Review of Systems Review of Systems   Physical Exam Triage Vital Signs ED Triage  Vitals [12/28/22 1614]  Enc Vitals Group     BP (!) 148/110     Pulse Rate 84     Resp 16     Temp 98.7 F (37.1 C)     Temp src      SpO2 98 %     Weight      Height      Head Circumference      Peak Flow      Pain Score 10     Pain Loc      Pain Edu?      Excl. in [place]?    No data found.  Updated Vital Signs BP (!) 148/110 (BP Location: Left Wrist)   Pulse 84   Temp 98.7 F (37.1 C)   Resp 16   LMP 09/17/2022   SpO2 98%   Visual Acuity Right Eye Distance:   Left Eye Distance:   Bilateral Distance:    Right Eye Near:   Left Eye Near:    Bilateral Near:     Physical Exam Vitals and nursing note reviewed.  Constitutional:      General: She is not in acute distress.    Appearance: She is obese. She  is not ill-appearing.  HENT:     Right Ear: Tympanic membrane normal.  Cardiovascular:     Rate and Rhythm: Normal rate and regular rhythm.     Pulses: Normal pulses.     Heart sounds: Normal heart sounds.  Pulmonary:     Effort: Pulmonary effort is normal.     Breath sounds: Normal breath sounds.  Abdominal:     General: Bowel sounds are normal.  Musculoskeletal:     Comments: Tenderness on palpation of the right sacroiliac joint.  Leg raise test is negative.  No bruising noted.  Power is 5/5 in lower extremities.  Deep tendon reflexes is 2+ in both lower extremities.  Neurological:     Mental Status: She is alert.      UC Treatments / Results  Labs (all labs ordered are listed, but only abnormal results are displayed) Labs Reviewed - No data to display  EKG   Radiology No results found.  Procedures Procedures (including critical care time)  Medications Ordered in UC Medications - No data to display  Initial Impression / Assessment and Plan / UC Course  I have reviewed the triage vital signs and the nursing notes.  Pertinent labs & imaging results that were available during my care of the patient were reviewed by me and considered in my medical decision making (see chart for details).     1.  Subacute back pain: Tylenol as needed for pain Gentle stretching exercises Heating pad use Return precautions given No indication for imaging NSAIDs contraindicated because of pregnancy. Final Clinical Impressions(s) / UC Diagnoses   Final diagnoses:  Subacute back pain     Discharge Instructions      Heating pad use on a 20-minute on-20 minutes off cycle as needed Gentle stretching exercises Tylenol as needed for pain Return to urgent care if you have worsening symptoms. Please follow-up with OB/GYN for routine obstetric care   ED Prescriptions   None    PDMP not reviewed this encounter.   Chase Picket, MD 12/28/22 [phone]

## 2022-12-28 NOTE — Discharge Instructions (Addendum)
Heating pad use on a 20-minute on-20 minutes off cycle as needed Gentle stretching exercises Tylenol as needed for pain Return to urgent care if you have worsening symptoms. Please follow-up with OB/GYN for routine obstetric care

## 2022-12-28 NOTE — ED Triage Notes (Signed)
Pt is here for back pain x 2-3 months

## 2023-01-01 ENCOUNTER — Encounter (HOSPITAL_COMMUNITY): Payer: Self-pay | Admitting: *Deleted

## 2023-01-01 ENCOUNTER — Inpatient Hospital Stay (HOSPITAL_COMMUNITY)
Admission: AD | Admit: 2023-01-01 | Discharge: 2023-01-01 | Disposition: A | Discharge status: Home / Self Care | Payer: Medicaid Other | Attending: Obstetrics & Gynecology | Admitting: Obstetrics & Gynecology

## 2023-01-01 DIAGNOSIS — Z3202 Encounter for pregnancy test, result negative: Secondary | ICD-10-CM | POA: Insufficient documentation

## 2023-01-01 DIAGNOSIS — M549 Dorsalgia, unspecified: Secondary | ICD-10-CM | POA: Diagnosis not present

## 2023-01-01 LAB — URINALYSIS, ROUTINE W REFLEX MICROSCOPIC
Bilirubin Urine: NEGATIVE
Glucose, UA: NEGATIVE mg/dL
Hgb urine dipstick: NEGATIVE
Ketones, ur: NEGATIVE mg/dL
Nitrite: NEGATIVE
Protein, ur: NEGATIVE mg/dL
Specific Gravity, Urine: 1.026 (ref 1.005–1.030)
Squamous Epithelial / HPF: >50 /HPF (ref 0–5)
pH: 5 (ref 5.0–8.0)

## 2023-01-01 LAB — HCG, QUANTITATIVE, PREGNANCY: hCG, Beta Chain, Quant, S: 1 m[IU]/mL (ref <5)

## 2023-01-01 LAB — ABO/RH: ABO/RH(D): B POS

## 2023-01-01 LAB — POCT PREGNANCY, URINE: Preg Test, Ur: NEGATIVE

## 2023-01-01 NOTE — MAU Provider Note (Signed)
Event Date/Time   First Provider Initiated Contact with Patient 01/01/23 1523      S Ms. Ebony Wilson is a 22 y.o. G0P0 patient who presents to MAU today with complaint of back pain. Patient endorses 2-3 month history of back pain that is worsening over time. She was evaluated at [facility] on 12/28/2022 and told she should present to MAU as her back pain was occurring 2/2 patient's report of being 2-3 months pregnant.  Patient reports concern for being 2-3 months pregnant. She reports 3 positive HPTs. She denies history of irregular menstrual cycles  O BP (!) 135/90 (BP Location: Right Arm)   Pulse 91   Temp 98.2 F (36.8 C) (Oral)   Resp 18   Ht 5' 5.5" (1.664 m)   Wt (!) 149.9 kg   LMP 09/17/2022   SpO2 98%   BMI 54.16 kg/m   Physical Exam Vitals and nursing note reviewed. Exam conducted with a chaperone present.  Constitutional:      General: She is not in acute distress.    Appearance: Normal appearance. She is obese.  Cardiovascular:     Rate and Rhythm: Normal rate.  Pulmonary:     Effort: Pulmonary effort is normal.  Skin:    Capillary Refill: Capillary refill takes less than 2 seconds.  Neurological:     Mental Status: She is alert and oriented to person, place, and time.  Psychiatric:        Mood and Affect: Mood normal.        Behavior: Behavior normal.        Thought Content: Thought content normal.        Judgment: Judgment normal.     A/P Medical screening exam complete Negative UPT in MAU Will defer ectopic workup until + Quant hCG is obtained Quant hCG 1  Results for orders placed or performed during the hospital encounter of 01/01/23 (from the past 24 hour(s))  Urinalysis, Routine w reflex microscopic -Urine, Clean Catch     Status: Abnormal   Collection Time: 01/01/23  2:37 PM  Result Value Ref Range   Color, Urine YELLOW YELLOW   APPearance CLOUDY (A) CLEAR   Specific Gravity, Urine 1.026 1.005 - 1.030   pH 5.0 5.0 - 8.0   Glucose, UA  NEGATIVE NEGATIVE mg/dL   Hgb urine dipstick NEGATIVE NEGATIVE   Bilirubin Urine NEGATIVE NEGATIVE   Ketones, ur NEGATIVE NEGATIVE mg/dL   Protein, ur NEGATIVE NEGATIVE mg/dL   Nitrite NEGATIVE NEGATIVE   Leukocytes,Ua MODERATE (A) NEGATIVE   RBC / HPF 0-5 0 - 5 RBC/hpf   WBC, UA 21-50 0 - 5 WBC/hpf   Bacteria, UA FEW (A) NONE SEEN   Squamous Epithelial / HPF >50 0 - 5 /HPF   Mucus PRESENT   Pregnancy, urine POC     Status: None   Collection Time: 01/01/23  3:09 PM  Result Value Ref Range   Preg Test, Ur NEGATIVE NEGATIVE  hCG, quantitative, pregnancy     Status: None   Collection Time: 01/01/23  3:20 PM  Result Value Ref Range   hCG, Beta Chain, Quant, S 1 <5 mIU/mL  ABO/Rh     Status: None   Collection Time: 01/01/23  3:23 PM  Result Value Ref Range   ABO/RH(D)      B POS Performed at [facility], [address], [place], [place] [address]     1630: Patient not in waiting area, lobby or area outside  [place] 1633: Voicemail left to call back  1645: 2nd attempt to find patient in waiting area, lobby or area outside [place]: Patient returned my call, confirmed she had departed facility. Results reviewed.            Given list of OB/GYN providers in area  Patient removed from MAU board.  Mallie Snooks, [place], MSN, [facility] 01/01/2023 7:32 PM

## 2023-01-01 NOTE — Discharge Instructions (Signed)
Prenatal Care Providers           [facility]  [address] [phone]  [facility]   [address]  [phone]  [facility]       [address] [phone]            [facility]     [address] [phone]          [facility] [place] [facility]   [address] [phone]  [facility]  [address] [phone]     [facility])  [address]   [phone]     [facility]  Phone: [phone]  [facility]  Phone: [phone]  [facility] Phone: [phone]  [facility] Phone: [phone]  [facility] Phone: [phone]  [facility] Phone: [phone]  [facility]  Phone: [phone]  

## 2023-01-01 NOTE — MAU Note (Addendum)
Ebony Wilson is a 22 y.o. at Unknown here in MAU reporting: says she went to [facility] in Nov and was told she was preg. Was told her to come here for an [place], "just stuck her hand in her vagina, did not do any testing (no blood or urine test)" was hoping she would get her period.  After she Christmas started having back pain, no period, started spotting on Christmas day. The back pain continues, no period.  +HPT on New Years, then started spotting a little bit more.  Continued to bleed off and on during Jan, none today. LMP: mid Oct, has regular periods Onset of complaint: December Pain score: severe Vitals:   01/01/23 1458  BP: (!) 135/90  Pulse: 91  Resp: 18  Temp: 98.2 F (36.8 C)  SpO2: 98%     Lab orders placed from triage:  UPT   Reviewing pt's chart, pt had a neg preg test in November at [facility], and in Dec she had a video visit and said she knew she wasn't preg.

## 2023-01-07 DIAGNOSIS — Z419 Encounter for procedure for purposes other than remedying health state, unspecified: Secondary | ICD-10-CM | POA: Diagnosis not present

## 2023-02-05 DIAGNOSIS — Z419 Encounter for procedure for purposes other than remedying health state, unspecified: Secondary | ICD-10-CM | POA: Diagnosis not present

## 2023-03-08 DIAGNOSIS — Z419 Encounter for procedure for purposes other than remedying health state, unspecified: Secondary | ICD-10-CM | POA: Diagnosis not present

## 2023-03-30 ENCOUNTER — Ambulatory Visit (HOSPITAL_COMMUNITY): Payer: Medicaid Other

## 2023-04-06 DIAGNOSIS — Z043 Encounter for examination and observation following other accident: Secondary | ICD-10-CM | POA: Diagnosis not present

## 2023-04-06 DIAGNOSIS — S93402A Sprain of unspecified ligament of left ankle, initial encounter: Secondary | ICD-10-CM | POA: Diagnosis not present

## 2023-04-06 DIAGNOSIS — W19XXXA Unspecified fall, initial encounter: Secondary | ICD-10-CM | POA: Diagnosis not present

## 2023-04-06 DIAGNOSIS — I10 Essential (primary) hypertension: Secondary | ICD-10-CM | POA: Diagnosis not present

## 2023-04-06 DIAGNOSIS — Z743 Need for continuous supervision: Secondary | ICD-10-CM | POA: Diagnosis not present

## 2023-04-07 ENCOUNTER — Ambulatory Visit (HOSPITAL_COMMUNITY): Payer: Medicaid Other

## 2023-04-07 DIAGNOSIS — Z419 Encounter for procedure for purposes other than remedying health state, unspecified: Secondary | ICD-10-CM | POA: Diagnosis not present

## 2023-04-13 ENCOUNTER — Ambulatory Visit (HOSPITAL_COMMUNITY): Payer: Medicaid Other

## 2023-04-21 DIAGNOSIS — S93402A Sprain of unspecified ligament of left ankle, initial encounter: Secondary | ICD-10-CM | POA: Diagnosis not present

## 2023-04-26 ENCOUNTER — Ambulatory Visit (HOSPITAL_COMMUNITY): Payer: Medicaid Other

## 2023-05-08 DIAGNOSIS — Z419 Encounter for procedure for purposes other than remedying health state, unspecified: Secondary | ICD-10-CM | POA: Diagnosis not present

## 2023-06-07 DIAGNOSIS — Z419 Encounter for procedure for purposes other than remedying health state, unspecified: Secondary | ICD-10-CM | POA: Diagnosis not present

## 2023-07-08 DIAGNOSIS — Z419 Encounter for procedure for purposes other than remedying health state, unspecified: Secondary | ICD-10-CM | POA: Diagnosis not present

## 2023-08-08 DIAGNOSIS — Z419 Encounter for procedure for purposes other than remedying health state, unspecified: Secondary | ICD-10-CM | POA: Diagnosis not present

## 2023-08-17 ENCOUNTER — Other Ambulatory Visit (INDEPENDENT_AMBULATORY_CARE_PROVIDER_SITE_OTHER): Payer: Self-pay | Admitting: Physician Assistant

## 2023-08-17 DIAGNOSIS — J4520 Mild intermittent asthma, uncomplicated: Secondary | ICD-10-CM

## 2023-08-20 ENCOUNTER — Encounter (HOSPITAL_COMMUNITY): Payer: Self-pay

## 2023-08-20 ENCOUNTER — Ambulatory Visit (HOSPITAL_COMMUNITY)
Admission: RE | Admit: 2023-08-20 | Discharge: 2023-08-20 | Disposition: A | Discharge status: Home / Self Care | Payer: Medicaid Other | Source: Ambulatory Visit | Attending: Emergency Medicine | Admitting: Emergency Medicine

## 2023-08-20 VITALS — BP 143/92 | HR 108 | Temp 98.3°F | Resp 16

## 2023-08-20 DIAGNOSIS — J4520 Mild intermittent asthma, uncomplicated: Secondary | ICD-10-CM | POA: Insufficient documentation

## 2023-08-20 DIAGNOSIS — Z760 Encounter for issue of repeat prescription: Secondary | ICD-10-CM | POA: Diagnosis not present

## 2023-08-20 DIAGNOSIS — Z113 Encounter for screening for infections with a predominantly sexual mode of transmission: Secondary | ICD-10-CM | POA: Diagnosis not present

## 2023-08-20 LAB — HIV ANTIBODY (ROUTINE TESTING W REFLEX): HIV Screen 4th Generation wRfx: NONREACTIVE

## 2023-08-20 LAB — POCT URINE PREGNANCY: Preg Test, Ur: NEGATIVE

## 2023-08-20 MED ORDER — ALBUTEROL SULFATE HFA 108 (90 BASE) MCG/ACT IN AERS: 1.0000 | INHALATION_SPRAY | Freq: Four times a day (QID) | RESPIRATORY_TRACT | 0 refills | Status: DC | PRN

## 2023-08-20 NOTE — ED Provider Notes (Signed)
[facility]    CSN: [id number] Arrival date & time: 08/20/23  1155      History   Chief Complaint Chief Complaint  Patient presents with   SEXUALLY TRANSMITTED DISEASE    HPI Ebony Wilson is a 22 y.o. female.   Patient presents requesting STD screening. Patient denies any symptoms at this time or known exposures. Patient reports recent unprotected sexual intercourse and is also requesting a pregnancy test. Patient states LMP is unknown. Patient is requesting a refill of her albuterol inhaler. Patient is also requesting note for work stating that she cannot use dish soap at work due to developing hives on hands after use.      Past Medical History:  Diagnosis Date   COVID-19 07/09/2020   Headache    at her period    Hypertension    as a child (had to diet to get bp down, no longer an issue)   PTSD (post-traumatic stress disorder) 07/18/2016    Patient Active Problem List   Diagnosis Date Noted   Bacterial vaginosis 09/20/2018   PTSD (post-traumatic stress disorder) 07/18/2016   MDD (major depressive disorder), recurrent episode, severe ([facility]) 07/17/2016    Past Surgical History:  Procedure Laterality Date   CHOLECYSTECTOMY N/A 02/08/2018   Procedure: LAPAROSCOPIC CHOLECYSTECTOMY;  Surgeon: Axel Filler, MD;  Location: [facility];  Service: General;  Laterality: N/A;   TONSILECTOMY, ADENOIDECTOMY, BILATERAL MYRINGOTOMY AND TUBES     TONSILLECTOMY     age 4   WISDOM TOOTH EXTRACTION      OB History   No obstetric history on file.      Home Medications    Prior to Admission medications   Medication Sig Start Date End Date Taking? Authorizing Provider  acetaminophen (TYLENOL) 325 MG tablet Take 2 tablets (650 mg total) by mouth every 6 (six) hours as needed. 01/13/19   Lamptey, Britta Mccreedy, MD  albuterol (VENTOLIN HFA) 108 (90 Base) MCG/ACT inhaler Inhale 1-2 puffs into the lungs every 6 (six) hours as needed for wheezing or shortness of breath. 08/20/23    Wynonia Lawman A, NP  cetirizine (ZYRTEC) 10 MG tablet Take 1 tablet (10 mg total) by mouth daily. 10/30/22   Margaretann Loveless, PA-C  fluticasone (FLONASE) 50 MCG/ACT nasal spray Place 1 spray into both nostrils daily. 10/30/22   Margaretann Loveless, PA-C  montelukast (SINGULAIR) 10 MG tablet Take 1 tablet (10 mg total) by mouth at bedtime. 10/30/22   Margaretann Loveless, PA-C  famotidine (PEPCID) 20 MG tablet Take 1 tablet (20 mg total) by mouth 2 (two) times daily. 11/16/18 12/26/19  Domenick Gong, MD  loratadine (CLARITIN) 10 MG tablet Take 1 tablet (10 mg total) by mouth daily. One po daily x 5 days 01/16/19 12/26/19  Arby Barrette, MD    Family History Family History  Problem Relation Age of Onset   Other Mother        has a hole in her heart    Social History Social History   Tobacco Use   Smoking status: Never   Smokeless tobacco: Never  Vaping Use   Vaping status: Never Used  Substance Use Topics   Alcohol use: No   Drug use: No     Allergies   Watermelon [citrullus vulgaris] and Pineapple   Review of Systems Review of Systems  Constitutional:  Negative for chills, fatigue and fever.  HENT:  Negative for congestion.   Respiratory:  Negative for cough, chest tightness and shortness of  breath.   Cardiovascular:  Negative for chest pain.  Gastrointestinal:  Negative for abdominal pain, diarrhea, nausea and vomiting.  Genitourinary:  Positive for menstrual problem. Negative for difficulty urinating, flank pain, frequency, genital sores, hematuria, pelvic pain, vaginal bleeding, vaginal discharge and vaginal pain.  Skin:  Negative for color change.  Neurological:  Negative for dizziness and headaches.     Physical Exam Triage Vital Signs ED Triage Vitals  Encounter Vitals Group     BP 08/20/23 1216 (!) 143/92     Systolic BP Percentile --      Diastolic BP Percentile --      Pulse Rate 08/20/23 1213 (!) 108     Resp 08/20/23 1213 16     Temp  08/20/23 1213 98.3 F (36.8 C)     Temp Source 08/20/23 1213 Oral     SpO2 08/20/23 1213 98 %     Weight --      Height --      Head Circumference --      Peak Flow --      Pain Score 08/20/23 1215 0     Pain Loc --      Pain Education --      Exclude from Growth Chart --    No data found.  Updated Vital Signs BP (!) 143/92   Pulse (!) 108   Temp 98.3 F (36.8 C) (Oral)   Resp 16   LMP  (LMP Unknown)   SpO2 98%   Visual Acuity Right Eye Distance:   Left Eye Distance:   Bilateral Distance:    Right Eye Near:   Left Eye Near:    Bilateral Near:     Physical Exam Vitals and nursing note reviewed.  Constitutional:      General: She is awake.     Appearance: Normal appearance. She is well-developed and well-groomed. She is obese.  Cardiovascular:     Rate and Rhythm: Normal rate.     Heart sounds: Normal heart sounds.  Pulmonary:     Effort: Pulmonary effort is normal. No respiratory distress.     Breath sounds: Normal breath sounds. No wheezing.  Abdominal:     General: Abdomen is flat. There is no distension.     Palpations: Abdomen is soft. There is no mass.     Tenderness: There is no abdominal tenderness. There is no guarding or rebound.     Hernia: No hernia is present.  Skin:    General: Skin is warm and dry.  Neurological:     Mental Status: She is alert.  Psychiatric:        Behavior: Behavior is cooperative.      UC Treatments / Results  Labs (all labs ordered are listed, but only abnormal results are displayed) Labs Reviewed  HIV ANTIBODY (ROUTINE TESTING W REFLEX)  RPR  POCT URINE PREGNANCY  CERVICOVAGINAL ANCILLARY ONLY    EKG   Radiology No results found.  Procedures Procedures (including critical care time)  Medications Ordered in UC Medications - No data to display  Initial Impression / Assessment and Plan / UC Course  I have reviewed the triage vital signs and the nursing notes.  Pertinent labs & imaging results that were  available during my care of the patient were reviewed by me and considered in my medical decision making (see chart for details).     Patient presented requesting STD screening and pregnancy test.  Denies any symptoms at this time or known exposures.  Reports recent unprotected sexual intercourse.  Patient states LMP is unknown.  Patient is also requesting refill of her albuterol inhaler.  Patient is also asking for a note stating that she cannot use additional blood work due to developing hives on her hands after use. No significant findings upon exam.  Ordered STD testing and pregnancy test. Urine pregnancy was negative.  Recommended following up with an OB/GYN regarding menstrual issues. Patient states she has a new patient visit on 9/16 with a primary care doctor.  Recommended discussing a note from primary care doctor regarding reactions to dish soap at work. Given refill for albuterol inhaler.  Discussed follow-up and return precautions. Final Clinical Impressions(s) / UC Diagnoses   Final diagnoses:  Screening for STD (sexually transmitted disease)  Medication refill     Discharge Instructions      Your pregnancy test is negative. Please follow-up with OBGYN regarding menstrual cycle issues. Your results will return in the next few days and someone will call you with positive results and prescribe medication if needed. I have refilled your albuterol inhaler today. At your appointment on 9/16, discuss getting doctor's orders regarding being unable to use dish soap at work.     ED Prescriptions     Medication Sig Dispense Auth. Provider   albuterol (VENTOLIN HFA) 108 (90 Base) MCG/ACT inhaler Inhale 1-2 puffs into the lungs every 6 (six) hours as needed for wheezing or shortness of breath. 18 g Wynonia Lawman A, NP      PDMP not reviewed this encounter.   Wynonia Lawman A, NP 08/20/23 1240

## 2023-08-20 NOTE — ED Triage Notes (Signed)
Pt states she wants to be tested for STD's denies any symptoms.  Pt also states she needs a note for work because she is allergic to the dish soap they use it gives her hives.

## 2023-08-20 NOTE — Discharge Instructions (Addendum)
Your pregnancy test is negative. Please follow-up with OBGYN regarding menstrual cycle issues. Your results will return in the next few days and someone will call you with positive results and prescribe medication if needed. I have refilled your albuterol inhaler today. At your appointment on 9/16, discuss getting doctor's orders regarding being unable to use dish soap at work.

## 2023-08-21 LAB — RPR: RPR Ser Ql: NONREACTIVE

## 2023-08-23 ENCOUNTER — Ambulatory Visit (INDEPENDENT_AMBULATORY_CARE_PROVIDER_SITE_OTHER): Payer: Medicaid Other | Admitting: Nurse Practitioner

## 2023-08-23 VITALS — BP 109/83 | HR 97 | Temp 98.6°F | Ht 65.5 in | Wt 327.8 lb

## 2023-08-23 DIAGNOSIS — N926 Irregular menstruation, unspecified: Secondary | ICD-10-CM | POA: Diagnosis not present

## 2023-08-23 DIAGNOSIS — R300 Dysuria: Secondary | ICD-10-CM | POA: Insufficient documentation

## 2023-08-23 DIAGNOSIS — Z0000 Encounter for general adult medical examination without abnormal findings: Secondary | ICD-10-CM

## 2023-08-23 DIAGNOSIS — Z131 Encounter for screening for diabetes mellitus: Secondary | ICD-10-CM | POA: Diagnosis not present

## 2023-08-23 DIAGNOSIS — J4520 Mild intermittent asthma, uncomplicated: Secondary | ICD-10-CM

## 2023-08-23 DIAGNOSIS — N390 Urinary tract infection, site not specified: Secondary | ICD-10-CM | POA: Diagnosis not present

## 2023-08-23 DIAGNOSIS — Z124 Encounter for screening for malignant neoplasm of cervix: Secondary | ICD-10-CM

## 2023-08-23 DIAGNOSIS — Z113 Encounter for screening for infections with a predominantly sexual mode of transmission: Secondary | ICD-10-CM | POA: Diagnosis not present

## 2023-08-23 DIAGNOSIS — J302 Other seasonal allergic rhinitis: Secondary | ICD-10-CM | POA: Diagnosis not present

## 2023-08-23 LAB — CERVICOVAGINAL ANCILLARY ONLY
Bacterial Vaginitis (gardnerella): NEGATIVE
Candida Glabrata: NEGATIVE
Candida Vaginitis: NEGATIVE
Chlamydia: NEGATIVE
Comment: NEGATIVE
Comment: NEGATIVE
Comment: NEGATIVE
Comment: NEGATIVE
Comment: NEGATIVE
Comment: NORMAL
Neisseria Gonorrhea: NEGATIVE
Trichomonas: NEGATIVE

## 2023-08-23 LAB — POCT URINALYSIS DIP (CLINITEK)
Bilirubin, UA: NEGATIVE
Blood, UA: NEGATIVE
Glucose, UA: NEGATIVE mg/dL
Ketones, POC UA: NEGATIVE mg/dL
Leukocytes, UA: NEGATIVE
Nitrite, UA: POSITIVE — AB
POC PROTEIN,UA: NEGATIVE
Spec Grav, UA: >=1.03 — AB (ref 1.010–1.025)
Urobilinogen, UA: 0.2 U/dL
pH, UA: 6 (ref 5.0–8.0)

## 2023-08-23 LAB — POCT URINE PREGNANCY: Preg Test, Ur: NEGATIVE

## 2023-08-23 MED ORDER — MONTELUKAST SODIUM 10 MG PO TABS: 10.0000 mg | ORAL_TABLET | Freq: Every day | ORAL | 1 refills | Status: AC

## 2023-08-23 MED ORDER — SULFAMETHOXAZOLE-TRIMETHOPRIM 800-160 MG PO TABS: 1.0000 | ORAL_TABLET | Freq: Two times a day (BID) | ORAL | 0 refills | Status: AC

## 2023-08-23 MED ORDER — FLUTICASONE PROPIONATE 50 MCG/ACT NA SUSP: 1.0000 | Freq: Every day | NASAL | 2 refills | Status: AC

## 2023-08-23 NOTE — Progress Notes (Signed)
ASPatient wants to speak about a pap smear.   Pt wants to have iron checked  Pt asking for STD testing swab done. Pt asking for a full STD panel to be done with HIV and all of those test. As well as A1c blood test.   Asking for UTI test.

## 2023-08-23 NOTE — Assessment & Plan Note (Signed)
-   POCT URINALYSIS DIP (CLINITEK)  2. Cervical cancer screening  - Ambulatory referral to Obstetrics / Gynecology  3. Mild intermittent asthma without complication  - montelukast (SINGULAIR) 10 MG tablet; Take 1 tablet (10 mg total) by mouth at bedtime.  Dispense: 30 tablet; Refill: 1  4. Seasonal allergies  - montelukast (SINGULAIR) 10 MG tablet; Take 1 tablet (10 mg total) by mouth at bedtime.  Dispense: 30 tablet; Refill: 1 - fluticasone (FLONASE) 50 MCG/ACT nasal spray; Place 1 spray into both nostrils daily.  Dispense: 16 g; Refill: 2  5. Diabetes mellitus screening  - Hemoglobin A1c  6. Screen for STD (sexually transmitted disease)  - RPR+HIV+GC+CT Panel - Chlamydia/Gonococcus/Trichomonas, NAA - NuSwab Vaginitis Plus (VG+)  7. Routine adult health maintenance  - CBC - Basic Metabolic Panel  8. Irregular menstrual cycle  - Ambulatory referral to Obstetrics / Gynecology - POCT urine pregnancy  Follow up:  Follow up in 1 year for physical

## 2023-08-23 NOTE — Patient Instructions (Addendum)
1. Dysuria  - POCT URINALYSIS DIP (CLINITEK)  2. Cervical cancer screening  - Ambulatory referral to Obstetrics / Gynecology  3. Mild intermittent asthma without complication  - montelukast (SINGULAIR) 10 MG tablet; Take 1 tablet (10 mg total) by mouth at bedtime.  Dispense: 30 tablet; Refill: 1  4. Seasonal allergies  - montelukast (SINGULAIR) 10 MG tablet; Take 1 tablet (10 mg total) by mouth at bedtime.  Dispense: 30 tablet; Refill: 1 - fluticasone (FLONASE) 50 MCG/ACT nasal spray; Place 1 spray into both nostrils daily.  Dispense: 16 g; Refill: 2  5. Diabetes mellitus screening  - Hemoglobin A1c  6. Screen for STD (sexually transmitted disease)  - RPR+HIV+GC+CT Panel - Chlamydia/Gonococcus/Trichomonas, NAA - NuSwab Vaginitis Plus (VG+)  7. Routine adult health maintenance  - CBC - Basic Metabolic Panel  8. Irregular menstrual cycle  - Ambulatory referral to Obstetrics / Gynecology - POCT urine pregnancy  Follow up:  Follow up in 1 year for physical

## 2023-08-23 NOTE — Progress Notes (Signed)
Subjective   Patient ID: Ebony Wilson, female    DOB: 09-01-2001, 22 y.o.   MRN: [id number]  Chief Complaint  Patient presents with   New Patient (Initial Visit)    Referring provider: No ref. provider found  Ebony Wilson is a 22 y.o. female with Past Medical History: 07/09/2020: COVID-19 No date: Headache     Comment:  at her period  No date: Hypertension     Comment:  as a child (had to diet to get bp down, no longer an               issue) 07/18/2016: PTSD (post-traumatic stress disorder)   HPI  Patient presents today to establish care.  She does have a history of seasonal allergies and asthma.  She does need refill on inhaler, Singulair, Flonase.  Patient would like to be checked diabetes and have blood work routine health maintenance and blood counts.  Patient does need a referral to OB/GYN for Pap smear.  She states that she has had issues in the past with getting Pap smears completed.  Patient does need STD testing.  She states that she is not currently having symptoms but is sexually active.  Patient also states that she missed her last menstrual cycle like a pregnancy test today.  She would like to have a UA to check for UTI. Denies f/c/s, n/v/d, hemoptysis, PND, leg swelling Denies chest pain or edema    Allergies  Allergen Reactions   Watermelon [Citrullus Vulgaris] Itching and Swelling    Makes lips swell and throat itch!! NO MELONS!!   Pineapple Swelling     There is no immunization history on file for this patient.  Tobacco History: Social History   Tobacco Use  Smoking Status Never  Smokeless Tobacco Never   Counseling given: Not Answered   Outpatient Encounter Medications as of 08/23/2023  Medication Sig   albuterol (VENTOLIN HFA) 108 (90 Base) MCG/ACT inhaler Inhale 1-2 puffs into the lungs every 6 (six) hours as needed for wheezing or shortness of breath.   sulfamethoxazole-trimethoprim (BACTRIM DS) 800-160 MG tablet Take 1 tablet by mouth 2  (two) times daily for 3 days.   cetirizine (ZYRTEC) 10 MG tablet Take 1 tablet (10 mg total) by mouth daily. (Patient not taking: Reported on 08/23/2023)   fluticasone (FLONASE) 50 MCG/ACT nasal spray Place 1 spray into both nostrils daily.   montelukast (SINGULAIR) 10 MG tablet Take 1 tablet (10 mg total) by mouth at bedtime.   [DISCONTINUED] acetaminophen (TYLENOL) 325 MG tablet Take 2 tablets (650 mg total) by mouth every 6 (six) hours as needed.   [DISCONTINUED] famotidine (PEPCID) 20 MG tablet Take 1 tablet (20 mg total) by mouth 2 (two) times daily.   [DISCONTINUED] fluticasone (FLONASE) 50 MCG/ACT nasal spray Place 1 spray into both nostrils daily. (Patient not taking: Reported on 08/23/2023)   [DISCONTINUED] loratadine (CLARITIN) 10 MG tablet Take 1 tablet (10 mg total) by mouth daily. One po daily x 5 days   [DISCONTINUED] montelukast (SINGULAIR) 10 MG tablet Take 1 tablet (10 mg total) by mouth at bedtime. (Patient not taking: Reported on 08/23/2023)   No facility-administered encounter medications on file as of 08/23/2023.    Review of Systems  Review of Systems  Constitutional: Negative.   HENT: Negative.    Cardiovascular: Negative.   Gastrointestinal: Negative.   Allergic/Immunologic: Negative.   Neurological: Negative.   Psychiatric/Behavioral: Negative.       Objective:   BP 109/83  Pulse 97   Temp 98.6 F (37 C) (Oral)   Ht 5' 5.5" (1.664 m)   Wt (!) 327 lb 12.8 oz (148.7 kg)   LMP 07/13/2023 (Exact Date)   SpO2 100%   BMI 53.72 kg/m   Wt Readings from Last 5 Encounters:  08/23/23 (!) 327 lb 12.8 oz (148.7 kg)  01/01/23 (!) 330 lb 8 oz (149.9 kg)  04/22/21 (!) 322 lb 11.2 oz (146.4 kg)  07/09/20 300 lb (136.1 kg)  11/27/19 (!) 315 lb (142.9 kg)     Physical Exam Vitals and nursing note reviewed.  Constitutional:      General: She is not in acute distress.    Appearance: She is well-developed.  Cardiovascular:     Rate and Rhythm: Normal rate and  regular rhythm.  Pulmonary:     Effort: Pulmonary effort is normal.     Breath sounds: Normal breath sounds.  Neurological:     Mental Status: She is alert and oriented to person, place, and time.       Assessment & Plan:   Dysuria Assessment & Plan: - POCT URINALYSIS DIP (CLINITEK)  2. Cervical cancer screening  - Ambulatory referral to Obstetrics / Gynecology  3. Mild intermittent asthma without complication  - montelukast (SINGULAIR) 10 MG tablet; Take 1 tablet (10 mg total) by mouth at bedtime.  Dispense: 30 tablet; Refill: 1  4. Seasonal allergies  - montelukast (SINGULAIR) 10 MG tablet; Take 1 tablet (10 mg total) by mouth at bedtime.  Dispense: 30 tablet; Refill: 1 - fluticasone (FLONASE) 50 MCG/ACT nasal spray; Place 1 spray into both nostrils daily.  Dispense: 16 g; Refill: 2  5. Diabetes mellitus screening  - Hemoglobin A1c  6. Screen for STD (sexually transmitted disease)  - RPR+HIV+GC+CT Panel - Chlamydia/Gonococcus/Trichomonas, NAA - NuSwab Vaginitis Plus (VG+)  7. Routine adult health maintenance  - CBC - Basic Metabolic Panel  8. Irregular menstrual cycle  - Ambulatory referral to Obstetrics / Gynecology - POCT urine pregnancy  Follow up:  Follow up in 1 year for physical   Orders: -     POCT URINALYSIS DIP (CLINITEK)  Cervical cancer screening -     Ambulatory referral to Obstetrics / Gynecology  Mild intermittent asthma without complication -     Montelukast Sodium; Take 1 tablet (10 mg total) by mouth at bedtime.  Dispense: 30 tablet; Refill: 1  Seasonal allergies -     Montelukast Sodium; Take 1 tablet (10 mg total) by mouth at bedtime.  Dispense: 30 tablet; Refill: 1 -     Fluticasone Propionate; Place 1 spray into both nostrils daily.  Dispense: 16 g; Refill: 2  Diabetes mellitus screening -     Hemoglobin A1c  Screen for STD (sexually transmitted disease) -     RPR+HIV+GC+CT Panel -     Chlamydia/Gonococcus/Trichomonas,  NAA -     NuSwab Vaginitis Plus (VG+)  Routine adult health maintenance -     CBC -     Basic metabolic panel  Irregular menstrual cycle -     Ambulatory referral to Obstetrics / Gynecology -     POCT urine pregnancy  Urinary tract infection without hematuria, site unspecified -     Sulfamethoxazole-Trimethoprim; Take 1 tablet by mouth 2 (two) times daily for 3 days.  Dispense: 6 tablet; Refill: 0     Return in about 1 year (around 08/22/2024).   Ivonne Andrew, NP 08/23/2023

## 2023-08-23 NOTE — Addendum Note (Signed)
Addended by: Ivonne Andrew on: 08/23/2023 03:06 PM   Modules accepted: Level of Service

## 2023-08-24 ENCOUNTER — Telehealth: Payer: Self-pay | Admitting: General Practice

## 2023-08-24 LAB — BASIC METABOLIC PANEL
BUN/Creatinine Ratio: 19 (ref 9–23)
BUN: 13 mg/dL (ref 6–20)
CO2: 19 mmol/L — ABNORMAL LOW (ref 20–29)
Calcium: 9.4 mg/dL (ref 8.7–10.2)
Chloride: 108 mmol/L — ABNORMAL HIGH (ref 96–106)
Creatinine, Ser: 0.7 mg/dL (ref 0.57–1.00)
Glucose: 89 mg/dL (ref 70–99)
Potassium: 4 mmol/L (ref 3.5–5.2)
Sodium: 145 mmol/L — ABNORMAL HIGH (ref 134–144)
eGFR: 125 mL/min/{1.73_m2} (ref >59)

## 2023-08-24 LAB — HEMOGLOBIN A1C
Est. average glucose Bld gHb Est-mCnc: 111 mg/dL
Hgb A1c MFr Bld: 5.5 % (ref 4.8–5.6)

## 2023-08-24 LAB — CBC
Hematocrit: 40.3 % (ref 34.0–46.6)
Hemoglobin: 12.4 g/dL (ref 11.1–15.9)
MCH: 24.8 pg — ABNORMAL LOW (ref 26.6–33.0)
MCHC: 30.8 g/dL — ABNORMAL LOW (ref 31.5–35.7)
MCV: 80 fL (ref 79–97)
Platelets: 319 10*3/uL (ref 150–450)
RBC: 5.01 x10E6/uL (ref 3.77–5.28)
RDW: 14.2 % (ref 11.7–15.4)
WBC: 10.4 10*3/uL (ref 3.4–10.8)

## 2023-08-24 NOTE — Telephone Encounter (Signed)
Pt called stating she got her lab work done yesterday and got her results today and sees she has low iron. She is worried about that and wondering what she can/needs to do  Please call her back

## 2023-08-24 NOTE — Telephone Encounter (Signed)
Lvm for pt to call back for lab question. Ebony Wilson

## 2023-08-26 LAB — NUSWAB VAGINITIS PLUS (VG+)
Candida albicans, NAA: NEGATIVE
Candida glabrata, NAA: NEGATIVE
Chlamydia trachomatis, NAA: NEGATIVE
Neisseria gonorrhoeae, NAA: NEGATIVE
Trich vag by NAA: NEGATIVE

## 2023-08-26 NOTE — Telephone Encounter (Signed)
Sent my chart message. Kh

## 2023-09-07 DIAGNOSIS — Z419 Encounter for procedure for purposes other than remedying health state, unspecified: Secondary | ICD-10-CM | POA: Diagnosis not present

## 2023-09-24 ENCOUNTER — Ambulatory Visit (HOSPITAL_COMMUNITY): Payer: Medicaid Other

## 2023-10-08 DIAGNOSIS — Z419 Encounter for procedure for purposes other than remedying health state, unspecified: Secondary | ICD-10-CM | POA: Diagnosis not present

## 2023-10-27 ENCOUNTER — Encounter (HOSPITAL_COMMUNITY): Payer: Self-pay

## 2023-10-27 ENCOUNTER — Ambulatory Visit (HOSPITAL_COMMUNITY)
Admission: EM | Admit: 2023-10-27 | Discharge: 2023-10-27 | Disposition: A | Discharge status: Home / Self Care | Payer: Medicaid Other | Attending: Emergency Medicine | Admitting: Emergency Medicine

## 2023-10-27 ENCOUNTER — Ambulatory Visit (HOSPITAL_COMMUNITY): Payer: Medicaid Other

## 2023-10-27 DIAGNOSIS — J069 Acute upper respiratory infection, unspecified: Secondary | ICD-10-CM | POA: Diagnosis not present

## 2023-10-27 DIAGNOSIS — R051 Acute cough: Secondary | ICD-10-CM

## 2023-10-27 DIAGNOSIS — R0602 Shortness of breath: Secondary | ICD-10-CM | POA: Diagnosis not present

## 2023-10-27 DIAGNOSIS — R059 Cough, unspecified: Secondary | ICD-10-CM | POA: Diagnosis not present

## 2023-10-27 LAB — POC COVID19/FLU A&B COMBO
Covid Antigen, POC: NEGATIVE
Influenza A Antigen, POC: NEGATIVE
Influenza B Antigen, POC: NEGATIVE

## 2023-10-27 MED ORDER — GUAIFENESIN ER 600 MG PO TB12: 1200.0000 mg | ORAL_TABLET | Freq: Every day | ORAL | 0 refills | Status: AC

## 2023-10-27 MED ORDER — PROMETHAZINE-DM 6.25-15 MG/5ML PO SYRP: 5.0000 mL | ORAL_SOLUTION | Freq: Four times a day (QID) | ORAL | 0 refills | Status: DC | PRN

## 2023-10-27 NOTE — ED Provider Notes (Addendum)
[facility]    CSN: [id number] Arrival date & time: 10/27/23  1507      History   Chief Complaint Chief Complaint  Patient presents with   Cough    HPI Ebony Wilson is a 22 y.o. female.   Patient presents to clinic complaining of sore throat, dry nonproductive cough, bilateral ear pain and nasal congestion that started yesterday.  She has not had any fevers.  Denies any shortness of breath or wheezing.  She did try Robitussin for cough early this morning.  Reports she was recently exposed to someone who has the common cold.    The history is provided by the patient and medical records.  Cough   Past Medical History:  Diagnosis Date   COVID-19 07/09/2020   Headache    at her period    Hypertension    as a child (had to diet to get bp down, no longer an issue)   PTSD (post-traumatic stress disorder) 07/18/2016    Patient Active Problem List   Diagnosis Date Noted   Dysuria 08/23/2023   Bacterial vaginosis 09/20/2018   PTSD (post-traumatic stress disorder) 07/18/2016   MDD (major depressive disorder), recurrent episode, severe (HCC) 07/17/2016    Past Surgical History:  Procedure Laterality Date   CHOLECYSTECTOMY N/A 02/08/2018   Procedure: LAPAROSCOPIC CHOLECYSTECTOMY;  Surgeon: Axel Filler, MD;  Location: [facility] OR;  Service: General;  Laterality: N/A;   TONSILECTOMY, ADENOIDECTOMY, BILATERAL MYRINGOTOMY AND TUBES     TONSILLECTOMY     age 32   WISDOM TOOTH EXTRACTION      OB History   No obstetric history on file.      Home Medications    Prior to Admission medications   Medication Sig Start Date End Date Taking? Authorizing Provider  guaiFENesin (MUCINEX) 600 MG 12 hr tablet Take 2 tablets (1,200 mg total) by mouth daily for 5 days. 10/27/23 11/01/23 Yes Rinaldo Ratel, [place] N, FNP  promethazine-dextromethorphan (PROMETHAZINE-DM) 6.25-15 MG/5ML syrup Take 5 mLs by mouth 4 (four) times daily as needed for cough. 10/27/23  Yes Rinaldo Ratel,  [place] N, FNP  albuterol (VENTOLIN HFA) 108 (90 Base) MCG/ACT inhaler Inhale 1-2 puffs into the lungs every 6 (six) hours as needed for wheezing or shortness of breath. 08/20/23   Wynonia Lawman A, NP  cetirizine (ZYRTEC) 10 MG tablet Take 1 tablet (10 mg total) by mouth daily. Patient not taking: Reported on 08/23/2023 10/30/22   Margaretann Loveless, PA-C  fluticasone [facility]) 50 MCG/ACT nasal spray Place 1 spray into both nostrils daily. 08/23/23   Ivonne Andrew, NP  montelukast (SINGULAIR) 10 MG tablet Take 1 tablet (10 mg total) by mouth at bedtime. 08/23/23   Ivonne Andrew, NP  famotidine (PEPCID) 20 MG tablet Take 1 tablet (20 mg total) by mouth 2 (two) times daily. 11/16/18 12/26/19  Domenick Gong, MD  loratadine (CLARITIN) 10 MG tablet Take 1 tablet (10 mg total) by mouth daily. One po daily x 5 days 01/16/19 12/26/19  Arby Barrette, MD    Family History Family History  Problem Relation Age of Onset   Other Mother        has a hole in her heart    Social History Social History   Tobacco Use   Smoking status: Never   Smokeless tobacco: Never  Vaping Use   Vaping status: Never Used  Substance Use Topics   Alcohol use: No   Drug use: No     Allergies   Watermelon [citrullus  vulgaris] and Pineapple   Review of Systems Review of Systems  Per HPI   Physical Exam Triage Vital Signs ED Triage Vitals [10/27/23 1644]  Encounter Vitals Group     BP 129/86     Systolic BP Percentile      Diastolic BP Percentile      Pulse Rate 92     Resp 16     Temp 98.3 F (36.8 C)     Temp Source Oral     SpO2 96 %     Weight      Height 5\' 6"  (1.676 m)     Head Circumference      Peak Flow      Pain Score 9     Pain Loc      Pain Education      Exclude from Growth Chart    No data found.  Updated Vital Signs BP 129/86 (BP Location: Left Arm)   Pulse 92   Temp 98.3 F (36.8 C) (Oral)   Resp 16   Ht 5\' 6"  (1.676 m)   LMP 09/12/2023 (Exact Date)   SpO2  96%   BMI 52.91 kg/m   Visual Acuity Right Eye Distance:   Left Eye Distance:   Bilateral Distance:    Right Eye Near:   Left Eye Near:    Bilateral Near:     Physical Exam Vitals and nursing note reviewed.  Constitutional:      Appearance: Normal appearance.  HENT:     Head: Normocephalic and atraumatic.     Right Ear: External ear normal.     Left Ear: External ear normal.     Nose: Nose normal.     Mouth/Throat:     Mouth: Mucous membranes are moist.  Eyes:     Conjunctiva/sclera: Conjunctivae normal.  Cardiovascular:     Rate and Rhythm: Normal rate and regular rhythm.     Heart sounds: Normal heart sounds. No murmur heard. Pulmonary:     Effort: No respiratory distress.     Breath sounds: Normal breath sounds.  Musculoskeletal:        General: Normal range of motion.     Cervical back: Normal range of motion.  Lymphadenopathy:     Cervical: No cervical adenopathy.  Skin:    General: Skin is warm and dry.  Neurological:     General: No focal deficit present.     Mental Status: She is alert.  Psychiatric:        Mood and Affect: Mood normal.      UC Treatments / Results  Labs (all labs ordered are listed, but only abnormal results are displayed) Labs Reviewed  POC COVID19/FLU A&B COMBO    EKG   Radiology DG Chest 2 View  Result Date: 10/27/2023 CLINICAL DATA:  Cough and shortness of breath. EXAM: CHEST - 2 VIEW COMPARISON:  11/17/2018 FINDINGS: The cardiomediastinal contours are normal. The lungs are clear. Pulmonary vasculature is normal. No consolidation, pleural effusion, or pneumothorax. No acute osseous abnormalities are seen. IMPRESSION: No active cardiopulmonary disease. Electronically Signed   By: Narda Rutherford M.D.   On: 10/27/2023 20:09    Procedures Procedures (including critical care time)  Medications Ordered in UC Medications - No data to display  Initial Impression / Assessment and Plan / UC Course  I have reviewed the triage  vital signs and the nursing notes.  Pertinent labs & imaging results that were available during my care of the patient were reviewed  by me and considered in my medical decision making (see chart for details).  Vitals and triage reviewed, patient is hemodynamically stable.  Lungs are vesicular, heart with regular rate and rhythm.  Chest x-ray does not reveal any obvious infiltrate, awaiting official radiology overread.  COVID and flu testing are negative.  Suspect viral etiology to symptoms.  Symptomatic management discussed.  Work note provided.  Plan of care, follow-up care return precautions given, no questions at this time.  Official radiology interpretation shows no active cardiopulmonary disease.      Final Clinical Impressions(s) / UC Diagnoses   Final diagnoses:  Upper respiratory tract infection, unspecified type  Acute cough     Discharge Instructions      Your x-ray does not show any obvious pneumonia.  When the official radiology read is complete I will contact you if they interpret this differently or any treatment is indicated.  Your COVID and flu testing were negative.  This is most likely another viral illness.  Please rest, drink plenty of fluids and alternate between Tylenol and ibuprofen every 4-6 hours for any fever, body aches or chills.  You can use the cough medicine as needed, do not drink or drive on this as it may cause drowsiness.  I suggest 1200 mg of Mucinex daily as well as sleeping with a humidifier to help loosen up any secretions.  Your symptoms should improve over the next 7 to 10 days, if no improvement or any changes please return to clinic.     ED Prescriptions     Medication Sig Dispense Auth. Provider   promethazine-dextromethorphan (PROMETHAZINE-DM) 6.25-15 MG/5ML syrup Take 5 mLs by mouth 4 (four) times daily as needed for cough. 118 mL Rinaldo Ratel, [place] N, FNP   guaiFENesin (MUCINEX) 600 MG 12 hr tablet Take 2 tablets (1,200 mg total) by  mouth daily for 5 days. 10 tablet Shelvia Fojtik, [place] N, FNP      PDMP not reviewed this encounter.   Juandiego Kolenovic, [place] N, FNP 10/27/23 1738    Shi Blankenship, [place] N, [place] 10/27/23 2025

## 2023-10-27 NOTE — ED Triage Notes (Signed)
Patient here today with c/o cough, congestion, and bilat ear pain X 1 day. She took Robitussin this morning with no relief. Patient states that it just put her to sleep. Patient would also like a refill on her allergy medications. Her nephew has a common cold. She also works the [employer] at work.

## 2023-10-27 NOTE — Discharge Instructions (Addendum)
Your x-ray does not show any obvious pneumonia.  When the official radiology read is complete I will contact you if they interpret this differently or any treatment is indicated.  Your COVID and flu testing were negative.  This is most likely another viral illness.  Please rest, drink plenty of fluids and alternate between Tylenol and ibuprofen every 4-6 hours for any fever, body aches or chills.  You can use the cough medicine as needed, do not drink or drive on this as it may cause drowsiness.  I suggest 1200 mg of Mucinex daily as well as sleeping with a humidifier to help loosen up any secretions.  Your symptoms should improve over the next 7 to 10 days, if no improvement or any changes please return to clinic.

## 2023-11-07 DIAGNOSIS — Z419 Encounter for procedure for purposes other than remedying health state, unspecified: Secondary | ICD-10-CM | POA: Diagnosis not present

## 2023-11-23 ENCOUNTER — Ambulatory Visit (INDEPENDENT_AMBULATORY_CARE_PROVIDER_SITE_OTHER): Payer: Medicaid Other | Admitting: Obstetrics and Gynecology

## 2023-11-23 ENCOUNTER — Other Ambulatory Visit (HOSPITAL_COMMUNITY)
Admission: RE | Admit: 2023-11-23 | Discharge: 2023-11-23 | Disposition: A | Discharge status: Home / Self Care | Payer: Medicaid Other | Source: Ambulatory Visit | Attending: Obstetrics and Gynecology | Admitting: Obstetrics and Gynecology

## 2023-11-23 ENCOUNTER — Encounter: Payer: Self-pay | Admitting: Obstetrics and Gynecology

## 2023-11-23 VITALS — BP 132/85 | HR 99 | Ht 65.0 in | Wt 338.0 lb

## 2023-11-23 DIAGNOSIS — Z3202 Encounter for pregnancy test, result negative: Secondary | ICD-10-CM

## 2023-11-23 DIAGNOSIS — N926 Irregular menstruation, unspecified: Secondary | ICD-10-CM | POA: Diagnosis not present

## 2023-11-23 DIAGNOSIS — Z01419 Encounter for gynecological examination (general) (routine) without abnormal findings: Secondary | ICD-10-CM | POA: Insufficient documentation

## 2023-11-23 DIAGNOSIS — Z113 Encounter for screening for infections with a predominantly sexual mode of transmission: Secondary | ICD-10-CM

## 2023-11-23 DIAGNOSIS — Z6841 Body Mass Index (BMI) 40.0 and over, adult: Secondary | ICD-10-CM

## 2023-11-23 DIAGNOSIS — Z1339 Encounter for screening examination for other mental health and behavioral disorders: Secondary | ICD-10-CM

## 2023-11-23 LAB — POCT URINE PREGNANCY: Preg Test, Ur: NEGATIVE

## 2023-11-23 NOTE — Progress Notes (Addendum)
22 y.o. GYN presents for AEX/PAP/STD screening.  C/o periods are irregular.  Pt is trying to conceive, since she was 22 years old.

## 2023-11-23 NOTE — Progress Notes (Signed)
GYNECOLOGY ANNUAL PREVENTATIVE CARE ENCOUNTER NOTE  History:     Ebony Wilson is a 22 y.o. No obstetric history on file. female here for a routine annual gynecologic exam.  Current complaints: irregular menses and fertility questions.   Denies abnormal vaginal bleeding, discharge, pelvic pain, problems with intercourse or other gynecologic concerns.    Gynecologic History Patient's last menstrual period was 09/12/2023 (exact date). Contraception: none Last Pap: n/a Last mammogram:n/a  Obstetric History OB History  No obstetric history on file.    Past Medical History:  Diagnosis Date   COVID-19 07/09/2020   Headache    at her period    Hypertension    as a child (had to diet to get bp down, no longer an issue)   PTSD (post-traumatic stress disorder) 07/18/2016    Past Surgical History:  Procedure Laterality Date   CHOLECYSTECTOMY N/A 02/08/2018   Procedure: LAPAROSCOPIC CHOLECYSTECTOMY;  Surgeon: Axel Filler, MD;  Location: [facility] OR;  Service: General;  Laterality: N/A;   TONSILECTOMY, ADENOIDECTOMY, BILATERAL MYRINGOTOMY AND TUBES     TONSILLECTOMY     age 23   WISDOM TOOTH EXTRACTION      Current Outpatient Medications on File Prior to Visit  Medication Sig Dispense Refill   albuterol (VENTOLIN HFA) 108 (90 Base) MCG/ACT inhaler Inhale 1-2 puffs into the lungs every 6 (six) hours as needed for wheezing or shortness of breath. 18 g 0   cetirizine (ZYRTEC) 10 MG tablet Take 1 tablet (10 mg total) by mouth daily. (Patient not taking: Reported on 08/23/2023) 30 tablet 0   fluticasone (FLONASE) 50 MCG/ACT nasal spray Place 1 spray into both nostrils daily. 16 g 2   montelukast (SINGULAIR) 10 MG tablet Take 1 tablet (10 mg total) by mouth at bedtime. 30 tablet 1   promethazine-dextromethorphan (PROMETHAZINE-DM) 6.25-15 MG/5ML syrup Take 5 mLs by mouth 4 (four) times daily as needed for cough. 118 mL 0   [DISCONTINUED] famotidine (PEPCID) 20 MG tablet Take 1 tablet (20 mg  total) by mouth 2 (two) times daily. 40 tablet 0   [DISCONTINUED] loratadine (CLARITIN) 10 MG tablet Take 1 tablet (10 mg total) by mouth daily. One po daily x 5 days 14 tablet 0   No current facility-administered medications on file prior to visit.    Allergies  Allergen Reactions   Watermelon [Citrullus Vulgaris] Itching and Swelling    Makes lips swell and throat itch!! NO MELONS!!   Pineapple Swelling    Social History:  reports that she has never smoked. She uses smokeless tobacco. She reports that she does not drink alcohol and does not use drugs.  Family History  Problem Relation Age of Onset   Other Mother        has a hole in her heart    The following portions of the patient's history were reviewed and updated as appropriate: allergies, current medications, past family history, past medical history, past social history, past surgical history and problem list.  Review of Systems Pertinent items noted in HPI and remainder of comprehensive ROS otherwise negative.  Physical Exam:  BP 132/85   Pulse 99   Ht 5\' 5"  (1.651 m)   Wt (!) 338 lb (153.3 kg)   LMP 09/12/2023 (Exact Date)   BMI 56.25 kg/m  CONSTITUTIONAL: Well-developed, well-nourished female in no acute distress.  HENT:  Normocephalic, atraumatic, External right and left ear normal. Oropharynx is clear and moist EYES: Conjunctivae and EOM are normal.  NECK: Normal range of  motion, supple, no masses.  Normal thyroid. Moderate acanthosis nigricans noted SKIN: Skin is warm and dry. No rash noted. Not diaphoretic. No erythema. No pallor. MUSCULOSKELETAL: Normal range of motion. No tenderness.  No cyanosis, clubbing, or edema.  2+ distal pulses. NEUROLOGIC: Alert and oriented to person, place, and time. Normal reflexes, muscle tone coordination.  PSYCHIATRIC: Normal mood and affect. Normal behavior. Normal judgment and thought content. CARDIOVASCULAR: Normal heart rate noted, regular rhythm RESPIRATORY: Clear to  auscultation bilaterally. Effort and breath sounds normal, no problems with respiration noted. BREASTS: Symmetric in size. No masses, tenderness, skin changes, nipple drainage, or lymphadenopathy bilaterally. Performed in the presence of a chaperone. ABDOMEN: Soft, no distention noted.  No tenderness, rebound or guarding.  PELVIC: Normal appearing external genitalia and urethral meatus; normal appearing vaginal mucosa and cervix.  No abnormal discharge noted.  Pap smear obtained.  Vaginal swab obtained.  Normal uterine size, no other palpable masses, no uterine or adnexal tenderness.  Performed in the presence of a chaperone.   Assessment and Plan:    1. Women's annual routine gynecological examination [Z01.419] (Primary) Normal annual exam - Cytology - PAP( [place])  2. Routine screening for STI (sexually transmitted infection) Per pt request  - Cervicovaginal ancillary only( [place]) - Hepatitis C Antibody - Hepatitis B Surface AntiGEN  3. Irregular menses Very likely PCOs is the source of irregular menses. Discussed obesity and its influence on fertility and normal menstruation 2/2 insulin resistance. Advised at least 10% weight loss to increase chance of spontaneous pregnancy ie 30-40 pounds. Pt did note that OCP use in the past was successful for cycle control.  Pt advised to discuss with PCP possible use of GLP-1 meds.  - POCT urine pregnancy - Follicle stimulating hormone - Prolactin - Thyroid Panel With TSH  4. BMI 50.0-59.9, adult (HCC)   Will follow up results of pap smear and manage accordingly. Routine preventative health maintenance measures emphasized. Please refer to After Visit Summary for other counseling recommendations.     F/u in 6 months to evaluate weight loss progress and menses.  Mariel Aloe, MD, FACOG Obstetrician & Gynecologist, [facility] for [facility], [facility]

## 2023-11-24 ENCOUNTER — Ambulatory Visit (INDEPENDENT_AMBULATORY_CARE_PROVIDER_SITE_OTHER): Payer: Medicaid Other | Admitting: Nurse Practitioner

## 2023-11-24 ENCOUNTER — Other Ambulatory Visit: Payer: Self-pay

## 2023-11-24 ENCOUNTER — Telehealth: Payer: Self-pay

## 2023-11-24 VITALS — BP 123/81 | HR 92 | Temp 97.2°F | Wt 343.0 lb

## 2023-11-24 DIAGNOSIS — Z6841 Body Mass Index (BMI) 40.0 and over, adult: Secondary | ICD-10-CM | POA: Diagnosis not present

## 2023-11-24 DIAGNOSIS — E66813 Obesity, class 3: Secondary | ICD-10-CM

## 2023-11-24 DIAGNOSIS — E282 Polycystic ovarian syndrome: Secondary | ICD-10-CM | POA: Diagnosis not present

## 2023-11-24 LAB — THYROID PANEL WITH TSH
Free Thyroxine Index: 2.5 (ref 1.2–4.9)
T3 Uptake Ratio: 25 % (ref 24–39)
T4, Total: 10.1 ug/dL (ref 4.5–12.0)
TSH: 1.26 u[IU]/mL (ref 0.450–4.500)

## 2023-11-24 LAB — PROLACTIN: Prolactin: 26.8 ng/mL (ref 4.8–33.4)

## 2023-11-24 LAB — HEPATITIS C ANTIBODY: Hep C Virus Ab: NONREACTIVE

## 2023-11-24 LAB — FOLLICLE STIMULATING HORMONE: FSH: 40.8 m[IU]/mL

## 2023-11-24 LAB — HEPATITIS B SURFACE ANTIGEN: Hepatitis B Surface Ag: NEGATIVE

## 2023-11-24 MED ORDER — OZEMPIC (0.25 OR 0.5 MG/DOSE) 2 MG/3ML ~~LOC~~ SOPN: 0.2500 mg | PEN_INJECTOR | SUBCUTANEOUS | 2 refills | Status: DC

## 2023-11-24 NOTE — Patient Instructions (Addendum)
1. Class 3 severe obesity due to excess calories without serious comorbidity with body mass index (BMI) of 50.0 to 59.9 in adult (HCC) (Primary)  - Semaglutide,0.25 or 0.5MG /DOS, (OZEMPIC, 0.25 OR 0.5 MG/DOSE,) 2 MG/3ML SOPN; Inject 0.25 mg into the skin once a week.  Dispense: 3 mL; Refill: 2 - AMB Referral [facility]BCI Care Management  2. PCOS (polycystic ovarian syndrome)  - AMB Referral [facility]BCI Care Management   Follow up:  Follow up in 3 months   Polycystic Ovary Syndrome  Polycystic ovarian syndrome (PCOS) is a common hormonal disorder among women of reproductive age. In most women with PCOS, small fluid-filled sacs (cysts) grow on the ovaries. PCOS can cause problems with menstrual periods and make it hard to get and stay pregnant. If this condition is not treated, it can lead to serious health problems, such as diabetes and heart disease. What are the causes? The cause of this condition is not known. It may be due to certain factors, such as: Irregular menstrual cycle. High levels of certain hormones. Problems with the hormone that helps to control blood sugar (insulin). Certain genes. What increases the risk? You are more likely to develop this condition if you: Have a family history of PCOS or type 2 diabetes. Are overweight, eat unhealthy foods, and are not active. These factors may cause problems with blood sugar control, which can contribute to PCOS or PCOS symptoms. What are the signs or symptoms? Symptoms of this condition include: Ovarian cysts and sometimes pelvic pain. Menstrual periods that are not regular or are too heavy. Inability to get or stay pregnant. Increased growth of hair on the face, chest, stomach, back, thumbs, thighs, or toes. Acne or oily skin. Acne may develop during adulthood, and it may not get better with treatment. Weight gain or obesity. Patches of thickened and dark brown or black skin on the neck, arms, breasts, or thighs. How is this  diagnosed? This condition is diagnosed based on: Your medical history. A physical exam that includes a pelvic exam. Your health care provider may look for areas of increased hair growth on your skin. Tests, such as: An ultrasound to check the ovaries for cysts and to view the lining of the uterus. Blood tests to check levels of sugar (glucose), female hormone (testosterone), and female hormones (estrogen and progesterone). How is this treated? There is no cure for this condition, but treatment can help to manage symptoms and prevent more health problems from developing. Treatment varies depending on your symptoms and if you want to have a baby or if you need birth control. Treatment may include: Making nutrition and lifestyle changes. Taking the progesterone hormone to start a menstrual period. Taking birth control pills to help you have regular menstrual periods. Taking medicines such as: Medicines to make you ovulate, if you want to get pregnant. Medicine to reduce extra hair growth. Having surgery in severe cases. This may involve making small holes in one or both of your ovaries. This decreases the amount of testosterone that your body makes. Follow these instructions at home: Take over-the-counter and prescription medicines only as told by your health care provider. Follow a healthy meal plan that includes lean proteins, complex carbohydrates, fresh fruits and vegetables, low-fat dairy products, healthy fats, and fiber. If you are overweight, lose weight as told by your health care provider. Your health care provider can determine how much weight loss is best for you and can help you lose weight safely. Keep all follow-up visits. This  is important. Contact a health care provider if: Your symptoms do not get better with medicine. Your symptoms get worse or you develop new symptoms. Summary Polycystic ovarian syndrome (PCOS) is a common hormonal disorder among women of reproductive  age. PCOS can cause problems with menstrual periods and make it hard to get and stay pregnant. If this condition is not treated, it can lead to serious health problems, such as diabetes and heart disease. There is no cure for this condition, but treatment can help to manage symptoms and prevent more health problems from developing. This information is not intended to replace advice given to you by your health care provider. Make sure you discuss any questions you have with your health care provider. Document Revised: 05/02/2020 Document Reviewed: 05/02/2020 Elsevier Patient Education  2024 [employer].

## 2023-11-24 NOTE — Progress Notes (Signed)
Care Guide Pharmacy Note  11/24/2023 Name: Kamoria Vittitow MRN: [id number] DOB: Sep 02, 2001  Referred By: Ivonne Andrew, NP Reason for referral: Care Coordination (Outreach to schedule with pharm d )   Mielle Emslie is a 22 y.o. year old female who is a primary care patient of Ivonne Andrew, NP.  Audria Nine was referred to the pharmacist for assistance related to:  PCOS  Successful contact was made with the patient to discuss pharmacy services including being ready for the pharmacist to call at least 5 minutes before the scheduled appointment time and to have medication bottles and any blood pressure readings ready for review. The patient agreed to meet with the pharmacist via telephone visit on (date/time).12/23/2023  Penne Lash , RMA     [place]  [facility], [facility] Guide  Direct Dial: [phone]  Website: [email or web address]

## 2023-11-24 NOTE — Progress Notes (Unsigned)
Subjective   Patient ID: Ebony Wilson, female    DOB: 09-07-2001, 22 y.o.   MRN: [id number]  Chief Complaint  Patient presents with   Follow-up    Patient had Pap yesterday and was diagnosed with Follicle Stimulating Hormone and PCOS. Dr recommended AOZHYQ or Ozempic for weight management.    Referring provider: Ivonne Andrew, NP  Ebony Wilson is a 22 y.o. female with Past Medical History: 07/09/2020: COVID-19 No date: Headache     Comment:  at her period  No date: Hypertension     Comment:  as a child (had to diet to get bp down, no longer an               issue) 07/18/2016: PTSD (post-traumatic stress disorder)  HPI  Patient presents today for follow-up visit.  She did have an appointment with OB/GYN yesterday and was diagnosed with PCOS.  FSH level did come back elevated.  OB/GYN did recommend starting Ozempic.  We will try to order this for patient today.  Unsure if it will be approved through insurance. Denies f/c/s, n/v/d, hemoptysis, PND, leg swelling Denies chest pain or edema     Allergies  Allergen Reactions   Watermelon [Citrullus Vulgaris] Itching and Swelling    Makes lips swell and throat itch!! NO MELONS!!   Pineapple Swelling     There is no immunization history on file for this patient.  Tobacco History: Social History   Tobacco Use  Smoking Status Never  Smokeless Tobacco Current   Ready to quit: Not Answered Counseling given: Not Answered   Outpatient Encounter Medications as of 11/24/2023  Medication Sig   albuterol (VENTOLIN HFA) 108 (90 Base) MCG/ACT inhaler Inhale 1-2 puffs into the lungs every 6 (six) hours as needed for wheezing or shortness of breath.   cetirizine (ZYRTEC) 10 MG tablet Take 1 tablet (10 mg total) by mouth daily.   montelukast (SINGULAIR) 10 MG tablet Take 1 tablet (10 mg total) by mouth at bedtime.   promethazine-dextromethorphan (PROMETHAZINE-DM) 6.25-15 MG/5ML syrup Take 5 mLs by mouth 4 (four) times daily as  needed for cough.   Semaglutide,0.25 or 0.5MG /DOS, (OZEMPIC, 0.25 OR 0.5 MG/DOSE,) 2 MG/3ML SOPN Inject 0.25 mg into the skin once a week.   fluticasone (FLONASE) 50 MCG/ACT nasal spray Place 1 spray into both nostrils daily.   [DISCONTINUED] famotidine (PEPCID) 20 MG tablet Take 1 tablet (20 mg total) by mouth 2 (two) times daily.   [DISCONTINUED] loratadine (CLARITIN) 10 MG tablet Take 1 tablet (10 mg total) by mouth daily. One po daily x 5 days   No facility-administered encounter medications on file as of 11/24/2023.    Review of Systems  Review of Systems  Constitutional: Negative.   HENT: Negative.    Cardiovascular: Negative.   Gastrointestinal: Negative.   Allergic/Immunologic: Negative.   Neurological: Negative.   Psychiatric/Behavioral: Negative.       Objective:   BP 123/81   Pulse 92   Temp (!) 97.2 F (36.2 C)   Wt (!) 343 lb (155.6 kg)   LMP 09/12/2023 (Exact Date)   SpO2 100%   BMI 57.08 kg/m   Wt Readings from Last 5 Encounters:  11/24/23 (!) 343 lb (155.6 kg)  11/23/23 (!) 338 lb (153.3 kg)  08/23/23 (!) 327 lb 12.8 oz (148.7 kg)  01/01/23 (!) 330 lb 8 oz (149.9 kg)  04/22/21 (!) 322 lb 11.2 oz (146.4 kg)     Physical Exam Vitals and nursing note reviewed.  Constitutional:      General: She is not in acute distress.    Appearance: She is well-developed.  Cardiovascular:     Rate and Rhythm: Normal rate and regular rhythm.  Pulmonary:     Effort: Pulmonary effort is normal.     Breath sounds: Normal breath sounds.  Neurological:     Mental Status: She is alert and oriented to person, place, and time.       Assessment & Plan:   Class 3 severe obesity due to excess calories without serious comorbidity with body mass index (BMI) of 50.0 to 59.9 in adult (HCC) -     Ozempic (0.25 or 0.5 MG/DOSE); Inject 0.25 mg into the skin once a week.  Dispense: 3 mL; Refill: 2 -     AMB Referral VBCI Care Management -     Amb Ref to Medical Weight  Management  PCOS (polycystic ovarian syndrome) -     AMB Referral [facility]BCI Care Management -     Amb Ref to Medical Weight Management     Return in about 3 months (around 02/22/2024).   Ivonne Andrew, NP 11/25/2023

## 2023-11-25 ENCOUNTER — Encounter: Payer: Self-pay | Admitting: Nurse Practitioner

## 2023-11-25 LAB — CERVICOVAGINAL ANCILLARY ONLY
Bacterial Vaginitis (gardnerella): NEGATIVE
Candida Glabrata: NEGATIVE
Candida Vaginitis: NEGATIVE
Chlamydia: NEGATIVE
Comment: NEGATIVE
Comment: NEGATIVE
Comment: NEGATIVE
Comment: NEGATIVE
Comment: NEGATIVE
Comment: NORMAL
Neisseria Gonorrhea: NEGATIVE
Trichomonas: NEGATIVE

## 2023-11-25 LAB — CYTOLOGY - PAP
Comment: NEGATIVE
Diagnosis: NEGATIVE
High risk HPV: NEGATIVE

## 2023-12-02 ENCOUNTER — Other Ambulatory Visit: Payer: Self-pay | Admitting: Nurse Practitioner

## 2023-12-02 ENCOUNTER — Other Ambulatory Visit: Payer: Self-pay

## 2023-12-02 ENCOUNTER — Telehealth: Payer: Self-pay | Admitting: Nurse Practitioner

## 2023-12-02 DIAGNOSIS — E282 Polycystic ovarian syndrome: Secondary | ICD-10-CM

## 2023-12-02 MED ORDER — WEGOVY 0.25 MG/0.5ML ~~LOC~~ SOAJ: 0.2500 mg | SUBCUTANEOUS | 2 refills | Status: DC

## 2023-12-02 NOTE — Telephone Encounter (Signed)
Pharmacy Patient Advocate Encounter   Received notification from CoverMyMeds that prior authorization for [facility] is required/requested.   Insurance verification completed.   The patient is insured through [facility] [place] .   Per test claim: PA required; PA submitted to above mentioned insurance via CoverMyMeds Key/confirmation #/EOC [id number] Status is pending

## 2023-12-02 NOTE — Telephone Encounter (Signed)
Pharmacy Patient Advocate Encounter  Received notification from [facility] Medicaid that Prior Authorization for [facility] has been APPROVED from 12/02/2023 to 05/30/2024   PA #/Case ID/Reference #: [id number]

## 2023-12-02 NOTE — Telephone Encounter (Signed)
Copied from CRM [id number]. Topic: Clinical - Prescription Issue >> Nov 29, 2023  3:01 PM Ebony Wilson R wrote: Reason for CRM: [facility] for her to get the rx for Semaglutide,0.25 or 0.5MG /DOS, (OZEMPIC, 0.25 OR 0.5 MG/DOSE,) 2 MG/3ML SOPN filled to start taking it. >> Nov 30, 2023  9:31 AM Lewis Moccasin wrote: Will Fwd to [place] for Prior auth

## 2023-12-08 DIAGNOSIS — Z419 Encounter for procedure for purposes other than remedying health state, unspecified: Secondary | ICD-10-CM | POA: Diagnosis not present

## 2023-12-13 ENCOUNTER — Telehealth: Payer: Self-pay

## 2023-12-13 NOTE — Progress Notes (Signed)
 Complex Care Management Care Guide Note  12/13/2023 Name: Ebony Wilson MRN: [id number] DOB: 11-Jun-2001  Ebony Wilson is a 23 y.o. year old female who is a primary care patient of Oley Bascom RAMAN, NP and is actively engaged with the care management team. I reached out to Ebony Wilson by phone today to assist with re-scheduling  with the Pharmacist.  Follow up plan: Unsuccessful telephone outreach attempt made. A HIPAA compliant phone message was left for the patient providing contact information and requesting a return call.  Jeoffrey Buffalo , RMA     [facility]  [facility], [facility] Guide  Direct Dial: [phone]  Website: [email or web address]

## 2023-12-15 ENCOUNTER — Telehealth: Payer: Medicaid Other | Admitting: Obstetrics and Gynecology

## 2023-12-23 ENCOUNTER — Other Ambulatory Visit: Payer: Self-pay

## 2023-12-23 NOTE — Progress Notes (Signed)
Complex Care Management Care Guide Note  12/23/2023 Name: Ebony Wilson MRN: [id number] DOB: 16-Mar-2001  Ebony Wilson is a 23 y.o. year old female who is a primary care patient of Ivonne Andrew, NP and is actively engaged with the care management team. I reached out to Ebony Wilson by phone today to assist with re-scheduling  with the Pharmacist.  Follow up plan: Unsuccessful telephone outreach attempt made. A HIPAA compliant phone message was left for the patient providing contact information and requesting a return call.  Penne Lash , RMA     [facility]  [facility], [facility] Guide  Direct Dial: [phone]  Website: [email or web address]

## 2024-01-03 ENCOUNTER — Other Ambulatory Visit: Payer: Medicaid Other

## 2024-01-04 ENCOUNTER — Other Ambulatory Visit: Payer: Self-pay | Admitting: Pharmacist

## 2024-01-04 NOTE — Progress Notes (Signed)
01/04/2024 Name: Ebony Wilson MRN: [id number] DOB: July 26, 2001  Chief Complaint  Patient presents with   Medication Management    Wegovy    Ebony Wilson is a 23 y.o. year old female who presented for a telephone visit.   They were referred to the pharmacist by their PCP for assistance in managing weight management. GLP-1 initiation.   Subjective: Patient is a 23 year old female with a medical history significant for depression, obesity, and a recent diagnosis of polycystic ovarian disorder. She was referred to the Pharmacy team for initiation of Ozempic.  Since she does not have diabetes, her insurance did not cover Ozempic.  She was started on [facility] 12/03/23.  Care Team: Primary Care Provider: Ivonne Andrew, NP ; Next Scheduled Visit: 01/31/2024  Medication Access/Adherence  [facility]acy:  [id number] [place] [place] - [address] [facility] [address] [place] [address] Phone: [phone] Fax: [phone]  Spoke with the patient regarding their experience with [facility]. Patient reports affordability concerns with their medications: No  Patient reports access/transportation concerns to their pharmacy: No  No problems or side effects reported. Patient states they are currently eating only one meal per day.  Current medications:  Wegovy 0.25 weekly   Current meal patterns:  - Breakfast: Doesn't eat - Lunch Pancakes and chicken nuggets (from [facility]) - Supper Does not eat - Snacks says not snacking - Drinks Water mostly since starting medication  Current physical activity: walking   Says the smell of red meat bothers her after starting IHKVQQ.  Objective:  Lab Results  Component Value Date   HGBA1C 5.5 08/23/2023    Lab Results  Component Value Date   CREATININE 0.70 08/23/2023   BUN 13 08/23/2023   NA 145 (H) 08/23/2023   K 4.0 08/23/2023   CL 108 (H) 08/23/2023   CO2 19 (L) 08/23/2023     No results found for: "CHOL", "HDL", "LDLCALC", "LDLDIRECT", "TRIG", "CHOLHDL"  Medications Reviewed Today     Reviewed by Beecher Mcardle, [facility] (Pharmacist) on 01/04/24 at 1715  Med List Status: <None>   Medication Order Taking? Sig Documenting Provider Last Dose Status Informant  albuterol (VENTOLIN HFA) 108 (90 Base) MCG/ACT inhaler [id number] Yes Inhale 1-2 puffs into the lungs every 6 (six) hours as needed for wheezing or shortness of breath. Wynonia Lawman A, NP Taking Active   cetirizine (ZYRTEC) 10 MG tablet [id number] Yes Take 1 tablet (10 mg total) by mouth daily. Margaretann Loveless, [place] Taking Active     Discontinued 12/26/19 1156   fluticasone (FLONASE) 50 MCG/ACT nasal spray [id number] Yes Place 1 spray into both nostrils daily. Ivonne Andrew, NP Taking Active     Discontinued 12/26/19 1156   montelukast (SINGULAIR) 10 MG tablet [id number] Yes Take 1 tablet (10 mg total) by mouth at bedtime. Ivonne Andrew, NP Taking Active   promethazine-dextromethorphan (PROMETHAZINE-DM) 6.25-15 MG/5ML syrup [id number] No Take 5 mLs by mouth 4 (four) times daily as needed for cough.  Patient not taking: Reported on 01/04/2024   [place], [place] N, FNP Not Taking Active   Semaglutide-Weight Management [facility]) 0.25 MG/0.5ML Ivory Broad [id number]  Inject 0.25 mg into the skin once a week. Ivonne Andrew, NP  Active   Med List Note Marcie Mowers, Clovia Cuff, CPhT 04/21/12 8315): [facility] [phone].             Assessment/Plan:   Patient weighed 343 pounds at her last  office visit (prior to starting [facility]) She reported her weight as 339 pounds today.  Most Patients lose 4% of their body weight after the first two months.   She is not eating a healthy diet and is skipping meals.  She was encouraged to incorporate more protein and vegetables into her diet.  She works at [employer] which makes it difficult to not consume high glycemic processed foods and have access to healthy  food choices.   If deemed therapeutically appropriate, her dose should be increased to 0.5mg  once weekly for the next month and then increased to 1mg  weekly. She has a follow up appointment on 01/31/2024.  Obesity/Overweight: -- Provided motivational interviewing. Celebrated weight loss. --Encouraged patient to incorporate vegetables and protein in her diet as well as to eat several small meals throughout the day.  Follow Up Plan:   Patient said she called in her Wegovy but the Pharmacy did not have it in stock.  [facility] for pick up and the copay was $4.00 Patient requested a refill be sent in for Albuterol.  While I was on [facility] with [facility], the Technician said she would fax over a refill request. (We do not have our new processes in place yet for sending refills).  I will follow up with the Patient after her PCP appointment.   Beecher Mcardle, PharmD, BCACP [facility] Clinical Pharmacist [phone]

## 2024-01-07 ENCOUNTER — Encounter: Payer: Self-pay | Admitting: Nurse Practitioner

## 2024-01-07 ENCOUNTER — Ambulatory Visit: Payer: Medicaid Other | Admitting: Nurse Practitioner

## 2024-01-07 VITALS — BP 118/66 | HR 99 | Temp 97.5°F | Wt 346.6 lb

## 2024-01-07 DIAGNOSIS — G4700 Insomnia, unspecified: Secondary | ICD-10-CM | POA: Diagnosis not present

## 2024-01-07 DIAGNOSIS — F4310 Post-traumatic stress disorder, unspecified: Secondary | ICD-10-CM

## 2024-01-07 DIAGNOSIS — Z111 Encounter for screening for respiratory tuberculosis: Secondary | ICD-10-CM | POA: Diagnosis not present

## 2024-01-07 NOTE — Progress Notes (Signed)
Subjective   Patient ID: Ebony Wilson, female    DOB: [birth year], 23 y.o.   MRN: [id number]  Chief Complaint  Patient presents with   blood work    TB blood work requested     Referring provider: Ivonne Andrew, NP  Ebony Wilson is a 23 y.o. female with Past Medical History: 07/09/2020: COVID-19 No date: Headache     Comment:  at her period  No date: Hypertension     Comment:  as a child (had to diet to get bp down, no longer an               issue) 07/18/2016: PTSD (post-traumatic stress disorder)   HPI  Patient presents today for a TB test for her job.  We will check QuantiFERON gold.  Patient states that she has started going to weight loss clinic.  She is needing a referral today to psychiatry for PTSD and insomnia.  We will place referral today. Denies f/c/s, n/v/d, hemoptysis, PND, leg swelling Denies chest pain or edema     Allergies  Allergen Reactions   Watermelon [Citrullus Vulgaris] Itching and Swelling    Makes lips swell and throat itch!! NO MELONS!!   Pineapple Swelling     There is no immunization history on file for this patient.  Tobacco History: Social History   Tobacco Use  Smoking Status Never  Smokeless Tobacco Current   Ready to quit: Not Answered Counseling given: Not Answered   Outpatient Encounter Medications as of 01/07/2024  Medication Sig   albuterol (VENTOLIN HFA) 108 (90 Base) MCG/ACT inhaler Inhale 1-2 puffs into the lungs every 6 (six) hours as needed for wheezing or shortness of breath.   Semaglutide-Weight Management (WEGOVY) 0.25 MG/0.5ML SOAJ Inject 0.25 mg into the skin once a week.   cetirizine (ZYRTEC) 10 MG tablet Take 1 tablet (10 mg total) by mouth daily. (Patient not taking: Reported on 01/07/2024)   fluticasone (FLONASE) 50 MCG/ACT nasal spray Place 1 spray into both nostrils daily. (Patient not taking: Reported on 01/07/2024)   montelukast (SINGULAIR) 10 MG tablet Take 1 tablet (10 mg total) by mouth at  bedtime. (Patient not taking: Reported on 01/07/2024)   promethazine-dextromethorphan (PROMETHAZINE-DM) 6.25-15 MG/5ML syrup Take 5 mLs by mouth 4 (four) times daily as needed for cough. (Patient not taking: Reported on 01/07/2024)   [DISCONTINUED] famotidine (PEPCID) 20 MG tablet Take 1 tablet (20 mg total) by mouth 2 (two) times daily.   [DISCONTINUED] loratadine (CLARITIN) 10 MG tablet Take 1 tablet (10 mg total) by mouth daily. One po daily x 5 days   No facility-administered encounter medications on file as of 01/07/2024.    Review of Systems  Review of Systems  Constitutional: Negative.   HENT: Negative.    Cardiovascular: Negative.   Gastrointestinal: Negative.   Allergic/Immunologic: Negative.   Neurological: Negative.   Psychiatric/Behavioral: Negative.       Objective:   BP 118/66   Pulse 99   Temp (!) 97.5 F (36.4 C)   Wt (!) 346 lb 9.6 oz (157.2 kg)   SpO2 100%   BMI 57.68 kg/m   Wt Readings from Last 5 Encounters:  01/07/24 (!) 346 lb 9.6 oz (157.2 kg)  11/24/23 (!) 343 lb (155.6 kg)  11/23/23 (!) 338 lb (153.3 kg)  08/23/23 (!) 327 lb 12.8 oz (148.7 kg)  01/01/23 (!) 330 lb 8 oz (149.9 kg)     Physical Exam Vitals and nursing note reviewed.  Constitutional:  General: She is not in acute distress.    Appearance: She is well-developed.  Cardiovascular:     Rate and Rhythm: Normal rate and regular rhythm.  Pulmonary:     Effort: Pulmonary effort is normal.     Breath sounds: Normal breath sounds.  Neurological:     Mental Status: She is alert and oriented to person, place, and time.       Assessment & Plan:   PTSD (post-traumatic stress disorder) -     Ambulatory referral to Psychiatry  Insomnia, unspecified type -     Ambulatory referral to Psychiatry  Screening-pulmonary TB -     QuantiFERON-TB Gold Plus     Return in about 3 months (around 04/05/2024).   Ivonne Andrew, NP 01/24/2024

## 2024-01-08 DIAGNOSIS — Z419 Encounter for procedure for purposes other than remedying health state, unspecified: Secondary | ICD-10-CM | POA: Diagnosis not present

## 2024-01-11 ENCOUNTER — Telehealth: Payer: Self-pay | Admitting: Nurse Practitioner

## 2024-01-11 ENCOUNTER — Telehealth: Payer: Self-pay

## 2024-01-11 NOTE — Telephone Encounter (Signed)
Copied from CRM [id number]. Topic: General - Other >> Jan 11, 2024 10:35 AM Shelah Lewandowsky wrote: Reason for CRM: Patient states she needs proof that she had TB test done, please call [phone]

## 2024-01-11 NOTE — Telephone Encounter (Signed)
 See note

## 2024-01-12 ENCOUNTER — Telehealth: Payer: Self-pay

## 2024-01-12 NOTE — Telephone Encounter (Signed)
 Copied from CRM [id number]. Topic: General - Other >> Jan 11, 2024  4:49 PM Elle L wrote: Reason for CRM: The patient needs paperwork showing that she had a TB test in order to start work. She states she does not need the results at this time. Her call back number is [phone].  Pt will be sent my chart message to advise . KH

## 2024-01-13 LAB — QUANTIFERON-TB GOLD PLUS
QuantiFERON Nil Value: 0.03 [IU]/mL
QuantiFERON TB1 Ag Value: 0.03 [IU]/mL
QuantiFERON TB2 Ag Value: 0.03 [IU]/mL

## 2024-01-24 ENCOUNTER — Encounter: Payer: Self-pay | Admitting: Nurse Practitioner

## 2024-01-24 NOTE — Patient Instructions (Signed)
1. PTSD (post-traumatic stress disorder) (Primary)  - Ambulatory referral to Psychiatry  2. Insomnia, unspecified type  - Ambulatory referral to Psychiatry  3. Screening-pulmonary TB  - QuantiFERON-TB Gold Plus  Follow up:  Follow up in 6 months

## 2024-01-26 ENCOUNTER — Telehealth: Payer: Self-pay

## 2024-01-26 ENCOUNTER — Other Ambulatory Visit: Payer: Self-pay | Admitting: Nurse Practitioner

## 2024-01-26 DIAGNOSIS — E282 Polycystic ovarian syndrome: Secondary | ICD-10-CM

## 2024-01-26 DIAGNOSIS — J4520 Mild intermittent asthma, uncomplicated: Secondary | ICD-10-CM

## 2024-01-26 MED ORDER — PROMETHAZINE-DM 6.25-15 MG/5ML PO SYRP: 5.0000 mL | ORAL_SOLUTION | Freq: Four times a day (QID) | ORAL | 0 refills | Status: AC | PRN

## 2024-01-26 MED ORDER — ALBUTEROL SULFATE HFA 108 (90 BASE) MCG/ACT IN AERS: 1.0000 | INHALATION_SPRAY | Freq: Four times a day (QID) | RESPIRATORY_TRACT | 0 refills | Status: DC | PRN

## 2024-01-26 NOTE — Telephone Encounter (Signed)
 Please advise [facility]

## 2024-01-26 NOTE — Telephone Encounter (Signed)
Copied from CRM [id number]. Topic: Clinical - Medication Refill >> Jan 26, 2024  7:58 AM [name] wrote: Most Recent Primary Care Visit:  Provider: Ivonne Andrew  Department: [facility]  Visit Type: OFFICE VISIT  Date: 01/07/2024  Medication: Semaglutide-Weight Management (WEGOVY) 0.25 MG/0.5ML SOAJ [[id number]] albuterol (VENTOLIN HFA) 108 (90 Base) MCG/ACT inhaler [[id number]] promethazine-dextromethorphan (PROMETHAZINE-DM) 6.25-15 MG/5ML syrup [[id number]]  Has the patient contacted their pharmacy? No (Agent: If no, request that the patient contact the pharmacy for the refill. If patient does not wish to contact the pharmacy document the reason why and proceed with request.) (Agent: If yes, when and what did the pharmacy advise?)  Is this the correct pharmacy for this prescription? Yes If no, delete pharmacy and type the correct one.  This is the patient's preferred pharmacy:  [id number] [place], [place] - [address] AT [facility] [address]  [place] [address] Phone: [phone] Fax: [phone]   Has the prescription been filled recently? No  Is the patient out of the medication? Yes  Has the patient been seen for an appointment in the last year OR does the patient have an upcoming appointment? Yes  Can we respond through MyChart? No  Agent: Please be advised that Rx refills may take up to 3 business days. We ask that you follow-up with your pharmacy.

## 2024-01-26 NOTE — Telephone Encounter (Signed)
Wegovy refills available at pharmacy.

## 2024-01-26 NOTE — Telephone Encounter (Signed)
Copied from CRM [id number]. Topic: Clinical - Medical Advice >> Jan 26, 2024  8:02 AM Carlatta H wrote: Reason for CRM: Please call the patient and advise if the appointment for sleep paralysis and anxiety should be over the phone or in person.   Pt was called and advised to show up to appointment 2/24/5 . KH

## 2024-01-31 ENCOUNTER — Ambulatory Visit: Payer: Medicaid Other | Admitting: Nurse Practitioner

## 2024-02-05 DIAGNOSIS — Z419 Encounter for procedure for purposes other than remedying health state, unspecified: Secondary | ICD-10-CM | POA: Diagnosis not present

## 2024-02-07 ENCOUNTER — Encounter (INDEPENDENT_AMBULATORY_CARE_PROVIDER_SITE_OTHER): Payer: Medicaid Other | Admitting: Physician Assistant

## 2024-02-10 ENCOUNTER — Encounter (INDEPENDENT_AMBULATORY_CARE_PROVIDER_SITE_OTHER): Payer: Self-pay

## 2024-02-14 ENCOUNTER — Telehealth: Payer: Self-pay | Admitting: Pharmacist

## 2024-02-14 NOTE — Progress Notes (Signed)
   02/14/2024  Patient ID: Ebony Wilson, female   DOB: 18-Nov-2001, 23 y.o.   MRN: [id number]   Reason for referral: Medication Management  Referral source:  Angus Seller, NP   Reason for call: Follow up on Wegovy  Outreach:  Unsuccessful telephone call attempt #1 to patient.   HIPAA compliant voicemail left requesting a return call  Plan:  -I will make another outreach attempt to patient in 2 weeks.    Beecher Mcardle, PharmD, BCACP Clinical Pharmacist [phone]

## 2024-02-23 ENCOUNTER — Telehealth: Payer: Self-pay

## 2024-02-23 ENCOUNTER — Other Ambulatory Visit: Payer: Self-pay | Admitting: Nurse Practitioner

## 2024-02-23 NOTE — Telephone Encounter (Signed)
 Copied from CRM [id number]. Topic: Clinical - Medication Question >> Feb 23, 2024 12:36 PM Fredrich Romans wrote: Reason for CRM: Patient called in stating that pharmacist Laurelyn Sickle boyd stated that her dosage of wegovy was going to increased to 1 mg dosage.However the prescription hast been sent into pharmacy.

## 2024-02-24 NOTE — Telephone Encounter (Signed)
 Please advise [facility]

## 2024-03-02 ENCOUNTER — Telehealth: Payer: Self-pay | Admitting: Pharmacist

## 2024-03-02 NOTE — Progress Notes (Signed)
error 

## 2024-03-02 NOTE — Progress Notes (Signed)
   03/02/2024  Patient ID: Ebony Wilson, female   DOB: 05-23-2001, 23 y.o.   MRN: [id number]  Patient was called to follow up on Wegovy.  Unfortunately, she did not answer her phone. HIPAA compliant message was left on her voicemail,  Today's call was the 2nd unsuccessful phone call.     Plan: Call patient back in 3-4 weeks.  Beecher Mcardle, PharmD, BCACP Clinical Pharmacist [phone]

## 2024-03-15 ENCOUNTER — Ambulatory Visit (HOSPITAL_COMMUNITY): Payer: Self-pay | Admitting: Licensed Clinical Social Worker

## 2024-03-18 DIAGNOSIS — Z419 Encounter for procedure for purposes other than remedying health state, unspecified: Secondary | ICD-10-CM | POA: Diagnosis not present

## 2024-03-28 ENCOUNTER — Other Ambulatory Visit: Payer: Self-pay | Admitting: Nurse Practitioner

## 2024-03-28 DIAGNOSIS — J4520 Mild intermittent asthma, uncomplicated: Secondary | ICD-10-CM

## 2024-03-28 DIAGNOSIS — E282 Polycystic ovarian syndrome: Secondary | ICD-10-CM

## 2024-03-28 DIAGNOSIS — J302 Other seasonal allergic rhinitis: Secondary | ICD-10-CM

## 2024-03-28 DIAGNOSIS — E66813 Obesity, class 3: Secondary | ICD-10-CM

## 2024-03-28 MED ORDER — CETIRIZINE HCL 10 MG PO TABS: 10.0000 mg | ORAL_TABLET | Freq: Every day | ORAL | 0 refills | Status: AC

## 2024-03-28 MED ORDER — ALBUTEROL SULFATE HFA 108 (90 BASE) MCG/ACT IN AERS: 1.0000 | INHALATION_SPRAY | Freq: Four times a day (QID) | RESPIRATORY_TRACT | 0 refills | Status: AC | PRN

## 2024-03-28 MED ORDER — WEGOVY 0.25 MG/0.5ML ~~LOC~~ SOAJ
0.2500 mg | SUBCUTANEOUS | 2 refills | Status: AC
Start: 2024-03-28 — End: ?

## 2024-03-28 NOTE — Telephone Encounter (Signed)
 Copied from CRM [id number]. Topic: Clinical - Medication Refill >> Mar 28, 2024  1:08 PM Carlatta H wrote: Most Recent Primary Care Visit:  Provider: Jerrlyn Morel  Department: [facility]  Visit Type: OFFICE VISIT  Date: 01/07/2024  Medication: Semaglutide -Weight Management (WEGOVY ) 0.25 MG/0.5ML SOAJ [[id number]] albuterol  (VENTOLIN  HFA) 108 (90 Base) MCG/ACT inhaler [[id number]] cetirizine  (ZYRTEC ) 10 MG tablet [[id number]]  Has the patient contacted their pharmacy? No (Agent: If no, request that the patient contact the pharmacy for the refill. If patient does not wish to contact the pharmacy document the reason why and proceed with request.) (Agent: If yes, when and what did the pharmacy advise?)  Is this the correct pharmacy for this prescription? Yes If no, delete pharmacy and type the correct one.  This is the patient's preferred pharmacy:  [id number] - [place], [place] - [address] AT [facility] [address] [place] [address] Phone: [phone] Fax: [phone]   Has the prescription been filled recently? No  Is the patient out of the medication? Yes  Has the patient been seen for an appointment in the last year OR does the patient have an upcoming appointment? Yes  Can we respond through MyChart? Yes  Agent: Please be advised that Rx refills may take up to 3 business days. We ask that you follow-up with your pharmacy.

## 2024-04-04 ENCOUNTER — Ambulatory Visit (HOSPITAL_COMMUNITY): Payer: Self-pay | Admitting: Psychiatry

## 2024-04-06 ENCOUNTER — Telehealth: Payer: Self-pay | Admitting: Pharmacist

## 2024-04-06 NOTE — Progress Notes (Signed)
   04/06/2024  Patient ID: Ebony Wilson, female   DOB: [birth year], 23 y.o.   MRN: [id number]  Patient was called to follow up on Wegovy .  HIPAA identifiers were obtained.  Patient wondered if her Wegovy  prescription was ready for pick up. From chart review, Wegovy  0.25 mg was sent to the Patient's Pharmacy on 03/28/2024.  The Pharmacy was called with the Patient on conference call to see if it was ready for pick up but I had to leave a message and got disconnected from the Patient.  I called the Patient back to discuss but she did not answer. Her dose most likely needs to be titrated upwards as she has been on the starter therapy for several months.   Plan: Follow up with Patient in 3-5 business days. Follow up with Provider after I speak with Patient in 3-5 days.   Geronimo Krabbe, PharmD, BCACP Clinical Pharmacist [phone]

## 2024-04-11 ENCOUNTER — Telehealth: Payer: Self-pay | Admitting: Pharmacist

## 2024-04-11 DIAGNOSIS — Z79899 Other long term (current) drug therapy: Secondary | ICD-10-CM

## 2024-04-11 NOTE — Progress Notes (Unsigned)
   04/11/2024  Patient ID: Ebony Wilson, female   DOB: [birth year], 23 y.o.   MRN: [id number]  Called Patient to follow up on Wegovy . Unfortunately, she did not answer the phone. [facility]  sent on 03/28/24. Unfortunately, they did not answer the phone. I left messages on both voicemails requesting a call back.   Geronimo Krabbe, PharmD, BCACP Clinical Pharmacist [phone]

## 2024-04-17 DIAGNOSIS — Z419 Encounter for procedure for purposes other than remedying health state, unspecified: Secondary | ICD-10-CM | POA: Diagnosis not present

## 2024-04-21 ENCOUNTER — Ambulatory Visit: Admitting: Nurse Practitioner

## 2024-05-18 DIAGNOSIS — Z419 Encounter for procedure for purposes other than remedying health state, unspecified: Secondary | ICD-10-CM | POA: Diagnosis not present

## 2024-06-17 DIAGNOSIS — Z419 Encounter for procedure for purposes other than remedying health state, unspecified: Secondary | ICD-10-CM | POA: Diagnosis not present

## 2024-07-07 ENCOUNTER — Ambulatory Visit (HOSPITAL_COMMUNITY)

## 2024-07-18 DIAGNOSIS — Z419 Encounter for procedure for purposes other than remedying health state, unspecified: Secondary | ICD-10-CM | POA: Diagnosis not present

## 2024-08-18 DIAGNOSIS — Z419 Encounter for procedure for purposes other than remedying health state, unspecified: Secondary | ICD-10-CM | POA: Diagnosis not present

## 2024-08-28 ENCOUNTER — Encounter: Payer: Self-pay | Admitting: Nurse Practitioner

## 2024-08-28 ENCOUNTER — Telehealth: Admitting: Nurse Practitioner

## 2024-08-28 VITALS — Ht 65.0 in | Wt 353.0 lb

## 2024-08-28 DIAGNOSIS — R600 Localized edema: Secondary | ICD-10-CM | POA: Diagnosis not present

## 2024-08-28 NOTE — Progress Notes (Signed)
 Virtual Visit via Telephone Note  I connected with Ebony Wilson on 08/28/24 at  9:20 AM EDT by telephone and verified that I am speaking with the correct person using two identifiers.  Location: Patient: home Provider: office   I discussed the limitations, risks, security and privacy concerns of performing an evaluation and management service by telephone and the availability of in person appointments. I also discussed with the patient that there may be a patient responsible charge related to this service. The patient expressed understanding and agreed to proceed.   History of Present Illness:   Patient presents today through telephone visit for an acute visit.  She states that her lower extremities have been swelling recently.  She denies any discoloration to her lower extremities.  We discussed that this is hard to assess over a phone visit.  Patient states that she can come into the office this Wednesday.  She states that her menstrual cycle is late and that her breast have been tender.  Will probably need to check a pregnancy test when she comes in on Wednesday.  We discussed low-salt diet and elevating her legs.  Denies f/c/s, n/v/d, hemoptysis, PND, leg swelling Denies chest pain or edema     Observations/Objective:     08/28/2024   11:49 AM 01/07/2024    3:23 PM 01/07/2024    3:04 PM  Vitals with BMI  Height 5' 5    Weight 353 lbs  346 lbs 10 oz  BMI 58.74    Systolic  118 127  Diastolic  66 111  Pulse   99     Assessment and Plan:   1. Peripheral edema (Primary)  Low salt diet  Keep legs elevated  Follow up on wednesday       I discussed the assessment and treatment plan with the patient. The patient was provided an opportunity to ask questions and all were answered. The patient agreed with the plan and demonstrated an understanding of the instructions.   The patient was advised to call back or seek an in-person evaluation if the symptoms worsen or if the  condition fails to improve as anticipated.  I provided 23 minutes of non-face-to-face time during this encounter.   Bascom GORMAN Borer, NP

## 2024-08-30 ENCOUNTER — Ambulatory Visit (INDEPENDENT_AMBULATORY_CARE_PROVIDER_SITE_OTHER): Payer: Self-pay | Admitting: Nurse Practitioner

## 2024-08-30 VITALS — BP 148/66 | HR 112 | Temp 98.7°F | Ht 65.0 in | Wt 360.4 lb

## 2024-08-30 DIAGNOSIS — Z1329 Encounter for screening for other suspected endocrine disorder: Secondary | ICD-10-CM | POA: Diagnosis not present

## 2024-08-30 DIAGNOSIS — N926 Irregular menstruation, unspecified: Secondary | ICD-10-CM | POA: Diagnosis not present

## 2024-08-30 DIAGNOSIS — Z113 Encounter for screening for infections with a predominantly sexual mode of transmission: Secondary | ICD-10-CM

## 2024-08-30 DIAGNOSIS — R600 Localized edema: Secondary | ICD-10-CM

## 2024-08-30 LAB — POCT URINE PREGNANCY: Preg Test, Ur: NEGATIVE

## 2024-08-30 MED ORDER — HYDROCHLOROTHIAZIDE 12.5 MG PO CAPS: 12.5000 mg | ORAL_CAPSULE | Freq: Every day | ORAL | 0 refills | Status: AC

## 2024-08-30 NOTE — Progress Notes (Addendum)
 Subjective   Patient ID: Ebony Wilson, female    DOB: 2001-10-20, 23 y.o.   MRN: [id number]  Chief Complaint  Patient presents with   Follow-up    Pt presents for a follow up for swelling in her ankles     Exposure to STD    Pt would like to get tested today for STD/HIV     Referring provider: Oley Bascom RAMAN, NP  Ebony Wilson is a 23 y.o. female with Past Medical History: 07/09/2020: COVID-19 No date: Headache     Comment:  at her period  No date: Hypertension     Comment:  as a child (had to diet to get bp down, no longer an               issue) 07/18/2016: PTSD (post-traumatic stress disorder)   HPI  Patient presents today for an acute visit.  She has been having edema to her lower extremities for the past few weeks.  It has slightly improved since it started.  She states that she also has not had a period since this past April.  She does have a history of polycystic ovary.  Blood pressure is elevated in office today. Denies f/c/s, n/v/d, hemoptysis, PND.     Allergies  Allergen Reactions   Watermelon [Citrullus Vulgaris] Itching and Swelling    Makes lips swell and throat itch!! NO MELONS!!   Pineapple Swelling     There is no immunization history on file for this patient.  Tobacco History: Social History   Tobacco Use  Smoking Status Never  Smokeless Tobacco Current   Ready to quit: Not Answered Counseling given: Not Answered   Outpatient Encounter Medications as of 08/30/2024  Medication Sig   hydrochlorothiazide  (MICROZIDE ) 12.5 MG capsule Take 1 capsule (12.5 mg total) by mouth daily.   albuterol  (VENTOLIN  HFA) 108 (90 Base) MCG/ACT inhaler Inhale 1-2 puffs into the lungs every 6 (six) hours as needed for wheezing or shortness of breath. (Patient not taking: Reported on 08/28/2024)   cetirizine  (ZYRTEC ) 10 MG tablet Take 1 tablet (10 mg total) by mouth daily. (Patient not taking: Reported on 08/28/2024)   fluticasone  (FLONASE ) 50 MCG/ACT nasal  spray Place 1 spray into both nostrils daily. (Patient not taking: Reported on 01/07/2024)   montelukast  (SINGULAIR ) 10 MG tablet Take 1 tablet (10 mg total) by mouth at bedtime. (Patient not taking: Reported on 01/07/2024)   promethazine -dextromethorphan (PROMETHAZINE -DM) 6.25-15 MG/5ML syrup Take 5 mLs by mouth 4 (four) times daily as needed for cough. (Patient not taking: Reported on 08/28/2024)   Semaglutide -Weight Management (WEGOVY ) 0.25 MG/0.5ML SOAJ Inject 0.25 mg into the skin once a week. (Patient not taking: Reported on 08/28/2024)   [DISCONTINUED] famotidine  (PEPCID ) 20 MG tablet Take 1 tablet (20 mg total) by mouth 2 (two) times daily.   [DISCONTINUED] loratadine  (CLARITIN ) 10 MG tablet Take 1 tablet (10 mg total) by mouth daily. One po daily x 5 days   No facility-administered encounter medications on file as of 08/30/2024.    Review of Systems  Review of Systems  Constitutional: Negative.   HENT: Negative.    Cardiovascular: Negative.   Gastrointestinal: Negative.   Allergic/Immunologic: Negative.   Neurological: Negative.   Psychiatric/Behavioral: Negative.       Objective:   BP (!) 148/66 (BP Location: Left Arm, Patient Position: Sitting, Cuff Size: Large)   Pulse (!) 112   Temp 98.7 F (37.1 C) (Oral)   Ht 5' 5 (1.651 m)   [place] ROLLEN)  360 lb 6.4 oz (163.5 kg)   LMP 03/26/2024   SpO2 98%   BMI 59.97 kg/m   Wt Readings from Last 5 Encounters:  08/30/24 (!) 360 lb 6.4 oz (163.5 kg)  08/28/24 (!) 353 lb (160.1 kg)  01/07/24 (!) 346 lb 9.6 oz (157.2 kg)  11/24/23 (!) 343 lb (155.6 kg)  11/23/23 (!) 338 lb (153.3 kg)     Physical Exam Vitals and nursing note reviewed.  Constitutional:      General: She is not in acute distress.    Appearance: She is well-developed.  Cardiovascular:     Rate and Rhythm: Normal rate and regular rhythm.  Pulmonary:     Effort: Pulmonary effort is normal.     Breath sounds: Normal breath sounds.  Musculoskeletal:     Right lower  leg: Edema present.     Left lower leg: Edema present.  Neurological:     Mental Status: She is alert and oriented to person, place, and time.       Assessment & Plan:   Screen for STD (sexually transmitted disease) -     RPR+HIV+GC+CT Panel -     Chlamydia/Gonococcus/Trichomonas, NAA  Missed period -     POCT urine pregnancy  Peripheral edema -     hydroCHLOROthiazide ; Take 1 capsule (12.5 mg total) by mouth daily.  Dispense: 30 capsule; Refill: 0 -     Brain natriuretic peptide -     CBC -     Comprehensive metabolic panel with GFR  Thyroid  disorder screen -     TSH     Return if symptoms worsen or fail to improve.   Bascom GORMAN Borer, NP 08/30/2024

## 2024-08-31 ENCOUNTER — Ambulatory Visit: Payer: Self-pay

## 2024-08-31 LAB — COMPREHENSIVE METABOLIC PANEL WITH GFR
ALT: 18 IU/L (ref 0–32)
AST: 16 IU/L (ref 0–40)
Albumin: 4 g/dL (ref 4.0–5.0)
Alkaline Phosphatase: 120 IU/L — ABNORMAL HIGH (ref 41–116)
BUN/Creatinine Ratio: 14 (ref 9–23)
BUN: 11 mg/dL (ref 6–20)
Bilirubin Total: <0.2 mg/dL (ref 0.0–1.2)
CO2: 23 mmol/L (ref 20–29)
Calcium: 9.4 mg/dL (ref 8.7–10.2)
Chloride: 103 mmol/L (ref 96–106)
Creatinine, Ser: 0.8 mg/dL (ref 0.57–1.00)
Globulin, Total: 2.6 g/dL (ref 1.5–4.5)
Glucose: 116 mg/dL — ABNORMAL HIGH (ref 70–99)
Potassium: 4.4 mmol/L (ref 3.5–5.2)
Sodium: 139 mmol/L (ref 134–144)
Total Protein: 6.6 g/dL (ref 6.0–8.5)
eGFR: 105 mL/min/1.73 (ref >59)

## 2024-08-31 LAB — CBC
Hematocrit: 39.7 % (ref 34.0–46.6)
Hemoglobin: 12.7 g/dL (ref 11.1–15.9)
MCH: 25.9 pg — ABNORMAL LOW (ref 26.6–33.0)
MCHC: 32 g/dL (ref 31.5–35.7)
MCV: 81 fL (ref 79–97)
Platelets: 280 x10E3/uL (ref 150–450)
RBC: 4.9 x10E6/uL (ref 3.77–5.28)
RDW: 14.4 % (ref 11.7–15.4)
WBC: 9.9 x10E3/uL (ref 3.4–10.8)

## 2024-08-31 LAB — BRAIN NATRIURETIC PEPTIDE: BNP: 21.3 pg/mL (ref 0.0–100.0)

## 2024-08-31 LAB — TSH: TSH: 2.12 u[IU]/mL (ref 0.450–4.500)

## 2024-08-31 NOTE — Telephone Encounter (Signed)
 Call dropped after transfer. Phone disconnected during call- called pt back - call cannot be completed at this time. Will place in call backs.    Copied from CRM [id number]. Topic: Clinical - Red Word Triage >> Aug 31, 2024 12:39 PM Olam RAMAN wrote: Red Word that prompted transfer to Nurse Triage: PT keeps having high BP and high sugar BP 165 over 60 was boderline Sugar was 120 Has been feeling ill and faint a bit since yesterday CALLER IS UPSET

## 2024-08-31 NOTE — Telephone Encounter (Signed)
 This RN attempted to reach patient on the 3 available phone numbers in chart, all of them rang and stated  call could not be completed as dialed.

## 2024-09-01 NOTE — Telephone Encounter (Signed)
 Pt could not be reached. Ebony Wilson

## 2024-09-03 LAB — CHLAMYDIA/GONOCOCCUS/TRICHOMONAS, NAA
Chlamydia by NAA: NEGATIVE
Gonococcus by NAA: NEGATIVE
Trich vag by NAA: NEGATIVE

## 2024-09-03 LAB — RPR+HIV+GC+CT PANEL
Chlamydia trachomatis, NAA: NEGATIVE
HIV Screen 4th Generation wRfx: NONREACTIVE
Neisseria Gonorrhoeae by PCR: NEGATIVE
RPR Ser Ql: NONREACTIVE

## 2024-09-04 ENCOUNTER — Ambulatory Visit: Payer: Self-pay | Admitting: Nurse Practitioner

## 2024-09-30 DIAGNOSIS — R051 Acute cough: Secondary | ICD-10-CM | POA: Diagnosis not present

## 2024-09-30 DIAGNOSIS — R0602 Shortness of breath: Secondary | ICD-10-CM | POA: Diagnosis not present

## 2024-09-30 DIAGNOSIS — Z20822 Contact with and (suspected) exposure to covid-19: Secondary | ICD-10-CM | POA: Diagnosis not present

## 2024-09-30 DIAGNOSIS — Z743 Need for continuous supervision: Secondary | ICD-10-CM | POA: Diagnosis not present

## 2024-09-30 DIAGNOSIS — R079 Chest pain, unspecified: Secondary | ICD-10-CM | POA: Diagnosis not present

## 2024-09-30 DIAGNOSIS — R059 Cough, unspecified: Secondary | ICD-10-CM | POA: Diagnosis not present

## 2024-10-29 ENCOUNTER — Ambulatory Visit (HOSPITAL_COMMUNITY)

## 2024-11-01 ENCOUNTER — Ambulatory Visit (HOSPITAL_COMMUNITY)

## 2024-12-22 ENCOUNTER — Ambulatory Visit: Payer: Self-pay

## 2024-12-22 ENCOUNTER — Other Ambulatory Visit: Payer: Self-pay | Admitting: Nurse Practitioner

## 2024-12-22 MED ORDER — PROMETHAZINE-DM 6.25-15 MG/5ML PO SYRP: 5.0000 mL | ORAL_SOLUTION | Freq: Four times a day (QID) | ORAL | 0 refills | Status: AC | PRN

## 2024-12-22 NOTE — Telephone Encounter (Signed)
 FYI . Please advise . KH

## 2024-12-22 NOTE — Telephone Encounter (Signed)
 FYI Only or Action Required?: FYI only for provider: UC advised.  Patient was last seen in primary care on 08/30/2024 by Ebony Bascom RAMAN, NP.  Called Nurse Triage reporting Influenza.  Symptoms began a week ago.  Interventions attempted: Prescription medications: tessalon , zofran  and Rest, hydration, or home remedies.  Symptoms are: gradually improving.  Triage Disposition: See HCP Within 4 Hours (Or PCP Triage)  Patient/caregiver understands and will follow disposition?: Yes  Message from [facility] G sent at 12/22/2024 12:41 PM EST  Reason for Triage: persistant cough very dry ( green phlem ) --in the hospital Flu on the 9th.. cold sweats.. shortness of breath   Reason for Disposition  MILD difficulty breathing (e.g., minimal/no SOB at rest, SOB with walking, pulse < 100)  Answer Assessment - Initial Assessment Questions Has completed Tessalon . Cough not any better. Having bad spells at night causing SOB. Mild SOB with exertion as well.  Tried Video visit with [facility] provider with technical difficulty. Patient looking to be seen.  Intense dry coughing spells- green mucous still coming up. Body aches, chills. Fever broken. Nausea resolved.  HR elevated- resting at 120bpm while on call.  Endorses hydration.   Advised UC or ED for HR elevation and evaluation of breathing as office does not have any appt within Dispo. Pt understands and will look into going to UC.    1. DIAGNOSIS CONFIRMATION: When was the influenza diagnosed? By whom? Did you get a test for it?     ED 1/19 2. INFLUENZA MEDICINES: Were you prescribed any medicines for the influenza?  (e.g., zanamivir [Relenza], oseltamivir [Tamiflu]).      Out of window  3. SYMPTOMS: What is your main symptom or concern? (e.g., cough, fever, shortness of breath, muscle aches)     Cough, body aches, fever(resolved), Nausea (resolved)  4. ONSET: When did the symptoms start?      12/11/24 5. COUGH: Do you have a cough? If  Yes, ask: How bad is the cough?       Moderate-severe coughing spells causing SOB  6. FEVER: Do you have a fever? If Yes, ask: What is your temperature, how was it measured, and when did it start?     Had one but has resolved 7. BREATHING DIFFICULTY: Are you having any difficulty breathing? (e.g., normal; shortness of breath, wheezing, unable to speak)      SOB after 8. PREGNANCY: Is there any chance you are pregnant? When was your last menstrual period?     denies 9. HIGH RISK FOR COMPLICATIONS: Do you have any chronic medical problems? (e.g., asthma, heart or lung disease, obesity, weak immune system)     denies 10. PREGNANCY: Is there any chance you are pregnant? When was your last menstrual period?       Denies 11. O2 SATURATION MONITOR:  Do you use an oxygen saturation monitor (pulse oximeter) at home? If Yes, ask What is your reading (oxygen level) today? What is your usual oxygen saturation reading? (e.g., 95%)       denies  Protocols used: Influenza (Flu) Follow-up Call-[facility]
